# Patient Record
Sex: Female | Born: 1945 | Race: White | Hispanic: No | Marital: Married | Smoking: Never smoker
Health system: Southern US, Community
[De-identification: ages and names within clinical notes are randomized; demographics above are authoritative.]

## PROBLEM LIST (undated history)

## (undated) DIAGNOSIS — R112 Nausea with vomiting, unspecified: Secondary | ICD-10-CM

## (undated) DIAGNOSIS — K219 Gastro-esophageal reflux disease without esophagitis: Secondary | ICD-10-CM

## (undated) DIAGNOSIS — I10 Essential (primary) hypertension: Secondary | ICD-10-CM

## (undated) DIAGNOSIS — U071 COVID-19: Secondary | ICD-10-CM

## (undated) DIAGNOSIS — M539 Dorsopathy, unspecified: Secondary | ICD-10-CM

## (undated) DIAGNOSIS — Z9889 Other specified postprocedural states: Secondary | ICD-10-CM

## (undated) DIAGNOSIS — Z973 Presence of spectacles and contact lenses: Secondary | ICD-10-CM

## (undated) HISTORY — PX: COLONOSCOPY: SHX174

## (undated) HISTORY — PX: DILATION AND CURETTAGE OF UTERUS: SHX78

## (undated) HISTORY — PX: NOSE SURGERY: SHX723

---

## 2003-12-03 ENCOUNTER — Ambulatory Visit: Payer: Self-pay | Admitting: Internal Medicine

## 2005-01-02 ENCOUNTER — Ambulatory Visit: Payer: Self-pay | Admitting: Internal Medicine

## 2005-01-11 ENCOUNTER — Ambulatory Visit: Payer: Self-pay | Admitting: Internal Medicine

## 2006-01-10 ENCOUNTER — Ambulatory Visit: Payer: Self-pay | Admitting: Internal Medicine

## 2006-01-22 ENCOUNTER — Ambulatory Visit: Payer: Self-pay | Admitting: Gastroenterology

## 2007-01-14 ENCOUNTER — Ambulatory Visit: Payer: Self-pay | Admitting: Internal Medicine

## 2007-08-05 ENCOUNTER — Ambulatory Visit: Payer: Self-pay | Admitting: Unknown Physician Specialty

## 2008-01-20 ENCOUNTER — Ambulatory Visit: Payer: Self-pay | Admitting: Internal Medicine

## 2009-01-21 ENCOUNTER — Ambulatory Visit: Payer: Self-pay | Admitting: Internal Medicine

## 2010-01-27 ENCOUNTER — Ambulatory Visit: Payer: Self-pay | Admitting: Internal Medicine

## 2010-02-01 ENCOUNTER — Ambulatory Visit: Payer: Self-pay | Admitting: Unknown Physician Specialty

## 2011-04-20 ENCOUNTER — Ambulatory Visit: Payer: Self-pay | Admitting: Internal Medicine

## 2012-04-21 ENCOUNTER — Ambulatory Visit: Payer: Self-pay | Admitting: Internal Medicine

## 2013-06-24 DIAGNOSIS — M519 Unspecified thoracic, thoracolumbar and lumbosacral intervertebral disc disorder: Secondary | ICD-10-CM | POA: Insufficient documentation

## 2013-06-24 DIAGNOSIS — M5090 Cervical disc disorder, unspecified, unspecified cervical region: Secondary | ICD-10-CM | POA: Insufficient documentation

## 2013-06-24 DIAGNOSIS — I10 Essential (primary) hypertension: Secondary | ICD-10-CM | POA: Insufficient documentation

## 2013-07-22 ENCOUNTER — Ambulatory Visit: Payer: Self-pay | Admitting: Internal Medicine

## 2014-09-22 ENCOUNTER — Other Ambulatory Visit: Payer: Self-pay | Admitting: Internal Medicine

## 2014-09-22 DIAGNOSIS — Z1231 Encounter for screening mammogram for malignant neoplasm of breast: Secondary | ICD-10-CM

## 2014-09-29 ENCOUNTER — Ambulatory Visit: Payer: Self-pay

## 2014-10-06 ENCOUNTER — Other Ambulatory Visit: Payer: Self-pay | Admitting: Internal Medicine

## 2014-10-06 ENCOUNTER — Ambulatory Visit
Admission: RE | Admit: 2014-10-06 | Discharge: 2014-10-06 | Disposition: A | Discharge status: Home / Self Care | Payer: Medicare Other | Source: Ambulatory Visit | Attending: Internal Medicine | Admitting: Internal Medicine

## 2014-10-06 DIAGNOSIS — Z1231 Encounter for screening mammogram for malignant neoplasm of breast: Secondary | ICD-10-CM | POA: Diagnosis not present

## 2015-09-26 ENCOUNTER — Other Ambulatory Visit: Payer: Self-pay | Admitting: Internal Medicine

## 2015-09-26 DIAGNOSIS — Z1231 Encounter for screening mammogram for malignant neoplasm of breast: Secondary | ICD-10-CM

## 2015-10-14 ENCOUNTER — Ambulatory Visit
Admission: RE | Admit: 2015-10-14 | Discharge: 2015-10-14 | Disposition: A | Discharge status: Home / Self Care | Payer: Medicare HMO | Source: Ambulatory Visit | Attending: Internal Medicine | Admitting: Internal Medicine

## 2015-10-14 DIAGNOSIS — Z1231 Encounter for screening mammogram for malignant neoplasm of breast: Secondary | ICD-10-CM | POA: Insufficient documentation

## 2016-10-02 ENCOUNTER — Other Ambulatory Visit: Payer: Self-pay | Admitting: Internal Medicine

## 2016-10-02 DIAGNOSIS — Z1231 Encounter for screening mammogram for malignant neoplasm of breast: Secondary | ICD-10-CM

## 2016-10-23 ENCOUNTER — Ambulatory Visit
Admission: RE | Admit: 2016-10-23 | Discharge: 2016-10-23 | Disposition: A | Discharge status: Home / Self Care | Payer: Medicare HMO | Source: Ambulatory Visit | Attending: Internal Medicine | Admitting: Internal Medicine

## 2016-10-23 DIAGNOSIS — Z1231 Encounter for screening mammogram for malignant neoplasm of breast: Secondary | ICD-10-CM | POA: Insufficient documentation

## 2017-10-01 DIAGNOSIS — D369 Benign neoplasm, unspecified site: Secondary | ICD-10-CM | POA: Insufficient documentation

## 2017-10-08 ENCOUNTER — Other Ambulatory Visit: Payer: Self-pay | Admitting: Internal Medicine

## 2017-10-08 DIAGNOSIS — Z1231 Encounter for screening mammogram for malignant neoplasm of breast: Secondary | ICD-10-CM

## 2017-10-29 ENCOUNTER — Ambulatory Visit
Admission: RE | Admit: 2017-10-29 | Discharge: 2017-10-29 | Disposition: A | Discharge status: Home / Self Care | Payer: Medicare HMO | Source: Ambulatory Visit | Attending: Internal Medicine | Admitting: Internal Medicine

## 2017-10-29 DIAGNOSIS — Z1231 Encounter for screening mammogram for malignant neoplasm of breast: Secondary | ICD-10-CM | POA: Insufficient documentation

## 2018-09-23 ENCOUNTER — Other Ambulatory Visit: Payer: Self-pay | Admitting: Internal Medicine

## 2018-10-16 ENCOUNTER — Other Ambulatory Visit: Payer: Self-pay | Admitting: Internal Medicine

## 2018-10-16 DIAGNOSIS — Z1231 Encounter for screening mammogram for malignant neoplasm of breast: Secondary | ICD-10-CM

## 2018-11-21 ENCOUNTER — Ambulatory Visit
Admission: RE | Admit: 2018-11-21 | Discharge: 2018-11-21 | Disposition: A | Discharge status: Home / Self Care | Payer: Medicare HMO | Source: Ambulatory Visit | Attending: Internal Medicine | Admitting: Internal Medicine

## 2018-11-21 DIAGNOSIS — Z1231 Encounter for screening mammogram for malignant neoplasm of breast: Secondary | ICD-10-CM

## 2019-10-16 DIAGNOSIS — R42 Dizziness and giddiness: Secondary | ICD-10-CM

## 2019-10-16 HISTORY — DX: Dizziness and giddiness: R42

## 2019-10-28 ENCOUNTER — Other Ambulatory Visit: Payer: Self-pay | Admitting: Internal Medicine

## 2019-10-28 DIAGNOSIS — Z1231 Encounter for screening mammogram for malignant neoplasm of breast: Secondary | ICD-10-CM

## 2019-12-04 ENCOUNTER — Ambulatory Visit
Admission: RE | Admit: 2019-12-04 | Discharge: 2019-12-04 | Disposition: A | Discharge status: Home / Self Care | Payer: Medicare HMO | Source: Ambulatory Visit | Attending: Internal Medicine | Admitting: Internal Medicine

## 2019-12-04 ENCOUNTER — Other Ambulatory Visit: Payer: Self-pay

## 2019-12-04 DIAGNOSIS — Z1231 Encounter for screening mammogram for malignant neoplasm of breast: Secondary | ICD-10-CM | POA: Diagnosis present

## 2020-01-04 ENCOUNTER — Other Ambulatory Visit: Payer: Self-pay | Admitting: Podiatry

## 2020-01-04 DIAGNOSIS — M7672 Peroneal tendinitis, left leg: Secondary | ICD-10-CM

## 2020-01-04 DIAGNOSIS — S86312A Strain of muscle(s) and tendon(s) of peroneal muscle group at lower leg level, left leg, initial encounter: Secondary | ICD-10-CM

## 2020-01-07 ENCOUNTER — Other Ambulatory Visit: Payer: Self-pay

## 2020-01-07 ENCOUNTER — Ambulatory Visit
Admission: RE | Admit: 2020-01-07 | Discharge: 2020-01-07 | Disposition: A | Discharge status: Home / Self Care | Payer: Medicare HMO | Source: Ambulatory Visit | Attending: Podiatry | Admitting: Podiatry

## 2020-01-07 DIAGNOSIS — M7672 Peroneal tendinitis, left leg: Secondary | ICD-10-CM | POA: Insufficient documentation

## 2020-01-07 DIAGNOSIS — S86312A Strain of muscle(s) and tendon(s) of peroneal muscle group at lower leg level, left leg, initial encounter: Secondary | ICD-10-CM | POA: Insufficient documentation

## 2020-01-26 ENCOUNTER — Encounter: Payer: Self-pay | Admitting: Podiatry

## 2020-01-26 ENCOUNTER — Other Ambulatory Visit: Payer: Self-pay

## 2020-02-02 ENCOUNTER — Other Ambulatory Visit
Admission: RE | Admit: 2020-02-02 | Discharge: 2020-02-02 | Disposition: A | Discharge status: Home / Self Care | Payer: Medicare HMO | Source: Ambulatory Visit | Attending: Podiatry | Admitting: Podiatry

## 2020-02-02 ENCOUNTER — Other Ambulatory Visit: Payer: Self-pay

## 2020-02-02 DIAGNOSIS — Z20822 Contact with and (suspected) exposure to covid-19: Secondary | ICD-10-CM | POA: Diagnosis not present

## 2020-02-02 DIAGNOSIS — Z01812 Encounter for preprocedural laboratory examination: Secondary | ICD-10-CM | POA: Diagnosis present

## 2020-02-02 LAB — SARS CORONAVIRUS 2 (TAT 6-24 HRS): SARS Coronavirus 2: NEGATIVE

## 2020-02-03 ENCOUNTER — Other Ambulatory Visit: Payer: Self-pay | Admitting: Podiatry

## 2020-02-04 ENCOUNTER — Encounter
Admission: RE | Disposition: A | Discharge status: Home / Self Care | Payer: Self-pay | Source: Home / Self Care | Attending: Podiatry

## 2020-02-04 ENCOUNTER — Ambulatory Visit
Admission: RE | Admit: 2020-02-04 | Discharge: 2020-02-04 | Disposition: A | Discharge status: Home / Self Care | Payer: Medicare HMO | Attending: Podiatry | Admitting: Podiatry

## 2020-02-04 ENCOUNTER — Other Ambulatory Visit: Payer: Self-pay

## 2020-02-04 ENCOUNTER — Ambulatory Visit: Payer: Medicare HMO | Admitting: Anesthesiology

## 2020-02-04 ENCOUNTER — Encounter: Payer: Self-pay | Admitting: Podiatry

## 2020-02-04 DIAGNOSIS — Z79899 Other long term (current) drug therapy: Secondary | ICD-10-CM | POA: Diagnosis not present

## 2020-02-04 DIAGNOSIS — Z791 Long term (current) use of non-steroidal anti-inflammatories (NSAID): Secondary | ICD-10-CM | POA: Diagnosis not present

## 2020-02-04 DIAGNOSIS — G8929 Other chronic pain: Secondary | ICD-10-CM | POA: Diagnosis not present

## 2020-02-04 DIAGNOSIS — M25472 Effusion, left ankle: Secondary | ICD-10-CM | POA: Insufficient documentation

## 2020-02-04 DIAGNOSIS — X58XXXA Exposure to other specified factors, initial encounter: Secondary | ICD-10-CM | POA: Diagnosis not present

## 2020-02-04 DIAGNOSIS — M659 Synovitis and tenosynovitis, unspecified: Secondary | ICD-10-CM | POA: Diagnosis present

## 2020-02-04 DIAGNOSIS — S86312A Strain of muscle(s) and tendon(s) of peroneal muscle group at lower leg level, left leg, initial encounter: Secondary | ICD-10-CM | POA: Insufficient documentation

## 2020-02-04 DIAGNOSIS — Z09 Encounter for follow-up examination after completed treatment for conditions other than malignant neoplasm: Secondary | ICD-10-CM

## 2020-02-04 HISTORY — DX: Essential (primary) hypertension: I10

## 2020-02-04 HISTORY — DX: Gastro-esophageal reflux disease without esophagitis: K21.9

## 2020-02-04 HISTORY — PX: TENDON REPAIR: SHX5111

## 2020-02-04 HISTORY — DX: Presence of spectacles and contact lenses: Z97.3

## 2020-02-04 HISTORY — DX: Other specified postprocedural states: Z98.890

## 2020-02-04 HISTORY — DX: Dorsopathy, unspecified: M53.9

## 2020-02-04 HISTORY — DX: Nausea with vomiting, unspecified: R11.2

## 2020-02-04 SURGERY — TENDON REPAIR
Anesthesia: General | Site: Foot | Laterality: Left

## 2020-02-04 MED ORDER — ACETAMINOPHEN 10 MG/ML IV SOLN
INTRAVENOUS | Status: DC | PRN
  Administered 2020-02-04: 1000 mg via INTRAVENOUS

## 2020-02-04 MED ORDER — 0.9 % SODIUM CHLORIDE (POUR BTL) OPTIME
TOPICAL | Status: DC | PRN
  Administered 2020-02-04: 500 mL
  Administered 2020-02-04: 250 mL

## 2020-02-04 MED ORDER — BUPIVACAINE LIPOSOME 1.3 % IJ SUSP
INTRAMUSCULAR | Status: DC | PRN
  Administered 2020-02-04: 266 mg

## 2020-02-04 MED ORDER — ONDANSETRON HCL 4 MG/2ML IJ SOLN: 4.0000 mg | Freq: Once | INTRAMUSCULAR | Status: DC | PRN

## 2020-02-04 MED ORDER — MIDAZOLAM HCL 2 MG/2ML IJ SOLN
INTRAMUSCULAR | Status: DC | PRN
  Administered 2020-02-04: 2 mg via INTRAVENOUS

## 2020-02-04 MED ORDER — LIDOCAINE HCL (CARDIAC) PF 100 MG/5ML IV SOSY
PREFILLED_SYRINGE | INTRAVENOUS | Status: DC | PRN
  Administered 2020-02-04: 30 mg via INTRATRACHEAL

## 2020-02-04 MED ORDER — PROPOFOL 10 MG/ML IV BOLUS
INTRAVENOUS | Status: DC | PRN
  Administered 2020-02-04: 100 ug/kg/min via INTRAVENOUS
  Administered 2020-02-04: 100 mg via INTRAVENOUS

## 2020-02-04 MED ORDER — ASPIRIN EC 81 MG PO TBEC: 81.0000 mg | DELAYED_RELEASE_TABLET | Freq: Two times a day (BID) | ORAL | 0 refills | Status: AC

## 2020-02-04 MED ORDER — KETOROLAC TROMETHAMINE 30 MG/ML IJ SOLN: 15.0000 mg | Freq: Once | INTRAMUSCULAR | Status: DC | PRN

## 2020-02-04 MED ORDER — POVIDONE-IODINE 7.5 % EX SOLN: Freq: Once | CUTANEOUS | Status: DC

## 2020-02-04 MED ORDER — CEFAZOLIN SODIUM-DEXTROSE 2-4 GM/100ML-% IV SOLN
2.0000 g | INTRAVENOUS | Status: AC
  Administered 2020-02-04: 2 g via INTRAVENOUS

## 2020-02-04 MED ORDER — OXYCODONE-ACETAMINOPHEN 5-325 MG PO TABS: 1.0000 | ORAL_TABLET | Freq: Four times a day (QID) | ORAL | 0 refills | Status: AC | PRN

## 2020-02-04 MED ORDER — CEPHALEXIN 500 MG PO CAPS: 500.0000 mg | ORAL_CAPSULE | Freq: Three times a day (TID) | ORAL | 0 refills | Status: AC

## 2020-02-04 MED ORDER — ONDANSETRON HCL 4 MG/2ML IJ SOLN
INTRAMUSCULAR | Status: DC | PRN
  Administered 2020-02-04: 4 mg via INTRAVENOUS

## 2020-02-04 MED ORDER — LACTATED RINGERS IV SOLN: INTRAVENOUS | Status: DC

## 2020-02-04 MED ORDER — DEXAMETHASONE SODIUM PHOSPHATE 4 MG/ML IJ SOLN
INTRAMUSCULAR | Status: DC | PRN
  Administered 2020-02-04: 4 mg via INTRAVENOUS

## 2020-02-04 MED ORDER — FENTANYL CITRATE (PF) 100 MCG/2ML IJ SOLN: 25.0000 ug | INTRAMUSCULAR | Status: DC | PRN

## 2020-02-04 MED ORDER — BUPIVACAINE HCL (PF) 0.5 % IJ SOLN
INTRAMUSCULAR | Status: DC | PRN
  Administered 2020-02-04: 100 mg

## 2020-02-04 MED ORDER — FENTANYL CITRATE (PF) 250 MCG/5ML IJ SOLN
INTRAMUSCULAR | Status: DC | PRN
  Administered 2020-02-04: 25 ug via INTRAVENOUS
  Administered 2020-02-04: 100 ug via INTRAVENOUS
  Administered 2020-02-04 (×3): 25 ug via INTRAVENOUS

## 2020-02-04 MED ORDER — OXYCODONE HCL 5 MG/5ML PO SOLN
5.0000 mg | Freq: Once | ORAL | Status: DC | PRN
Start: 2020-02-04 — End: 2020-02-04

## 2020-02-04 MED ORDER — OXYCODONE HCL 5 MG PO TABS
5.0000 mg | ORAL_TABLET | Freq: Once | ORAL | Status: DC | PRN
Start: 2020-02-04 — End: 2020-02-04

## 2020-02-04 SURGICAL SUPPLY — 52 items
APL SKNCLS STERI-STRIP NONHPOA (GAUZE/BANDAGES/DRESSINGS)
BANDAGE ELASTIC 4 LF NS (GAUZE/BANDAGES/DRESSINGS) ×2 IMPLANT
BENZOIN TINCTURE PRP APPL 2/3 (GAUZE/BANDAGES/DRESSINGS) IMPLANT
BLADE MINI RND TIP GREEN BEAV (BLADE) ×2 IMPLANT
BLADE SURG 15 STRL LF DISP TIS (BLADE) IMPLANT
BLADE SURG 15 STRL SS (BLADE)
BNDG CMPR 75X41 PLY HI ABS (GAUZE/BANDAGES/DRESSINGS)
BNDG CMPR MED 5X4 ELC HKLP NS (GAUZE/BANDAGES/DRESSINGS) ×1
BNDG COHESIVE 4X5 TAN STRL (GAUZE/BANDAGES/DRESSINGS) ×2 IMPLANT
BNDG ESMARK 4X12 TAN STRL LF (GAUZE/BANDAGES/DRESSINGS) ×2 IMPLANT
BNDG GAUZE 4.5X4.1 6PLY STRL (MISCELLANEOUS) ×2 IMPLANT
BNDG STRETCH 4X75 STRL LF (GAUZE/BANDAGES/DRESSINGS) IMPLANT
CANISTER SUCT 1200ML W/VALVE (MISCELLANEOUS) ×2 IMPLANT
COVER LIGHT HANDLE UNIVERSAL (MISCELLANEOUS) ×4 IMPLANT
CUFF TOURN SGL QUICK 30 (TOURNIQUET CUFF) ×2
CUFF TOURN SGL QUICK 34 (TOURNIQUET CUFF) ×2
CUFF TRNQT CYL 30X4X21-28X (TOURNIQUET CUFF) ×1 IMPLANT
CUFF TRNQT CYL 34X4X40X1 (TOURNIQUET CUFF) ×1 IMPLANT
DURAPREP 26ML APPLICATOR (WOUND CARE) ×2 IMPLANT
ELECT REM PT RETURN 9FT ADLT (ELECTROSURGICAL) ×2
ELECTRODE REM PT RTRN 9FT ADLT (ELECTROSURGICAL) ×1 IMPLANT
ETHIBOND 2 0 GREEN CT 2 30IN (SUTURE) ×2 IMPLANT
GAUZE SPONGE 4X4 12PLY STRL (GAUZE/BANDAGES/DRESSINGS) ×2 IMPLANT
GAUZE XEROFORM 1X8 LF (GAUZE/BANDAGES/DRESSINGS) ×2 IMPLANT
GLOVE BIO SURGEON STRL SZ7 (GLOVE) ×2 IMPLANT
GLOVE SURG UNDER POLY LF SZ7 (GLOVE) ×2 IMPLANT
GOWN STRL REUS W/ TWL XL LVL3 (GOWN DISPOSABLE) ×1 IMPLANT
GOWN STRL REUS W/TWL XL LVL3 (GOWN DISPOSABLE) ×2
IV NS 250ML (IV SOLUTION)
IV NS 250ML BAXH (IV SOLUTION) IMPLANT
K-WIRE DBL END TROCAR 6X.045 (WIRE)
K-WIRE DBL END TROCAR 6X.062 (WIRE)
KIT TURNOVER KIT A (KITS) ×2 IMPLANT
KWIRE DBL END TROCAR 6X.045 (WIRE) IMPLANT
KWIRE DBL END TROCAR 6X.062 (WIRE) IMPLANT
NS IRRIG 500ML POUR BTL (IV SOLUTION) ×2 IMPLANT
PACK EXTREMITY ARMC (MISCELLANEOUS) ×2 IMPLANT
PAD ABD DERMACEA PRESS 5X9 (GAUZE/BANDAGES/DRESSINGS) ×1 IMPLANT
PADDING CAST BLEND 4X4 NS (MISCELLANEOUS) ×2 IMPLANT
PENCIL SMOKE EVACUATOR (MISCELLANEOUS) ×2 IMPLANT
RASP SM TEAR CROSS CUT (RASP) IMPLANT
SPLINT CAST 1 STEP 4X30 (MISCELLANEOUS) IMPLANT
STOCKINETTE IMPERVIOUS LG (DRAPES) ×2 IMPLANT
STRIP CLOSURE SKIN 1/4X4 (GAUZE/BANDAGES/DRESSINGS) ×2 IMPLANT
SUT ETHILON 4 0 PS 2 18 (SUTURE) ×2 IMPLANT
SUT ETHILON 5-0 FS-2 18 BLK (SUTURE) ×2 IMPLANT
SUT VIC AB 2-0 SH 27 (SUTURE) ×2
SUT VIC AB 2-0 SH 27XBRD (SUTURE) IMPLANT
SUT VIC AB 3-0 SH 27 (SUTURE) ×2
SUT VIC AB 3-0 SH 27X BRD (SUTURE) IMPLANT
SUT VIC AB 4-0 FS2 27 (SUTURE) ×2 IMPLANT
WAND TENDON TOPAZ 0 ANGL (MISCELLANEOUS) ×1 IMPLANT

## 2020-02-04 NOTE — Transfer of Care (Signed)
Immediate Anesthesia Transfer of Care Note  Patient: Alexandria Carroll  Procedure(s) Performed: FLEXOR TENDON REPAIR - SECOND LEFT (Left Foot)  Patient Location: PACU  Anesthesia Type: General  Level of Consciousness: awake, alert  and patient cooperative  Airway and Oxygen Therapy: Patient Spontanous Breathing and Patient connected to supplemental oxygen  Post-op Assessment: Post-op Vital signs reviewed, Patient's Cardiovascular Status Stable, Respiratory Function Stable, Patent Airway and No signs of Nausea or vomiting  Post-op Vital Signs: Reviewed and stable  Complications: No complications documented.

## 2020-02-04 NOTE — Op Note (Signed)
PODIATRY / FOOT AND ANKLE SURGERY OPERATIVE REPORT    SURGEON: Caroline More, DPM  PRE-OPERATIVE DIAGNOSIS:  1.  Left peroneus longus and brevis tendon tears with tenosynovitis and tendinosis 2.  Left ankle pain and swelling  POST-OPERATIVE DIAGNOSIS: Same  PROCEDURE(S): 1. Left peroneus brevis and peroneus longus tenosynovectomies 2. Left peroneus brevis and peroneus longus tendon debridements with Topaz 3. Left peroneus brevis tendon repair with retubularization 4. Left peroneus longus tendon anastomosis to peroneus brevis tendon  HEMOSTASIS: Left thigh tourniquet  ANESTHESIA: general with preoperative saphenous and popliteal nerve blocks per anesthesia  ESTIMATED BLOOD LOSS: 10 cc  FINDING(S): 1.  Left complete rupture of the peroneus longus tendon about the lateral calcaneal tubercle with tendinosis of tendon large amount of synovitis 2.  Left partial tear peroneus brevis tendon with tendinosis and synovitis  PATHOLOGY/SPECIMEN(S): None  INDICATIONS:   Alexandria Carroll is a 75 y.o. female who presents with chronic pain to the left lateral ankle.  Patient was treated by Dr. Cleda Mccreedy for this issue and was treated with a series of conservative measures including booting, taping, strapping, changes in shoe gear, bracing, injections, and physical therapy along with medication management.  Patient did not get better after this and a MRI was taken showing extensive damage to the peroneus longus and brevis tendons with potentially complete rupture of the peroneus longus tendon with tenosynovitis of both tendons and tendinosis.  Discussed all treatment options with the patient involving both conservative and surgical attempts at correction include potential risks and complications and at this time patient is elected for surgical procedure today consisting of peroneal tendon repair with debridement and tenosynovectomy's with possible anastomosis of peroneus longus to brevis tendon.  All  questions answered including postoperative course.  No guarantees given.  DESCRIPTION: After obtaining full informed written consent, the patient was brought back to the operating room and placed supine upon the operating table.  The patient received IV antibiotics prior to induction.  The patient had a preoperative popliteal and saphenous nerve block performed by anesthesia prior to the procedure.  After obtaining adequate anesthesia, the patient was prepped and draped in the standard fashion.  An Esmarch bandage was used to exsanguinate the left lower extremity and the pneumatic thigh tourniquet was inflated.  Attention was then directed to the left lateral ankle where an incision was made from the posterior aspect of the lateral malleolus along the course of the peroneal tendons to the lateral foot to about the calcaneocuboid joint.  The incision was deepened to the subcutaneous tissues utilizing sharp and blunt dissection care was taken to identify and retract all vital neurovascular structures and all venous contributories were cauterized as necessary.  At this time the peroneus longus and brevis tendon sheaths were identified and the tendon sheath was incised along the course the entirety of the incision of the large amount of synovitic fluid was able to be released from the tendon sheath.  The tendons were then inspected and visualized and the peroneus longus tendon appeared to be completely ruptured and retracted to the level of the lateral tubercle of the calcaneus and the distal stump appeared to be nowhere and site along the course the incision line.  The peroneus longus tendon appeared to be thickened and appeared to be very frayed with some scar tissue.  The peroneus brevis tendon was also identified and noted to have a split thickness tear along the entirety of the incision.  The split thickness tear of the peroneus brevis  tendon was then debrided further removing a small portion of the tendon as  it appeared to be frayed and completely torn.  The tendon still appeared to have at least 50% of its original tissue after the debridement was performed.  Further debridement was performed of the peroneal tendon sheath removing all synovitic tissue from the area.  The peroneus brevis tendon was then debrided removing the fibrotic stump to the tendon to fresh healthy looking tendon.  Both tendons were then debrided further with the Topaz instrumentation.  The peroneus brevis tendon was then retubularized with 2-0 Ethibond in a running interconnected type stitch.  The peroneus longus tendon was then anastomosed to the peroneus brevis tendon behind the posterior lateral malleolus all the way to the remaining portion of the tendon which is around the distal tip of the fibula, this was performed with a combination of 2-0 Ethibond and 2-0 Vicryl.  The tendon repairs were then reinforced further with 2-0 Vicryl.  The tendons were then brought through range of motion and noted to sit excellently behind the posterior lateral malleolus and appeared to have good gliding motion.  The surgical site was flushed with copious amounts normal sterile saline.  The peroneal retinaculum was then reapproximated well coapted with a combination of 2-0 Ethibond and 2-0 Vicryl.  The peroneal tendon sheath was then reapproximated well coapted with 3-0 Vicryl.  The subcutaneous tissues and reapproximated well coapted with 4-0 Vicryl and the skin was reapproximated well coapted with 4-0 nylon in combination of simple and horizontal mattress type stitching.  The pneumatic ankle tourniquet was deflated and a prompt hyperemic response was noted to all digits of the left foot.  Hemostasis appeared to be well under control.  Applied postoperative dressing consisting of Xeroform to the incision line followed by 4 x 4 gauze, ABD, Kerlix, web roll, posterior splint, Ace wrap.  Patient tolerated the procedure and anesthesia well was transferred to  recovery room vital signs stable vascular status intact all toes left foot.  The patient will be discharged home with the appropriate follow-up instructions and orders as well as medications which have been sent to the pharmacy prior to discharge.  Patient is to follow-up in clinic within 1 week of surgical date.  Patient is to remain nonweightbearing and keep surgical dressings clean, dry, and intact until that time and take medications as prescribed as well as follow-up orders.  Discussed with husband in detail after procedure  COMPLICATIONS: None  CONDITION: Good, stable  Caroline More, DPM

## 2020-02-04 NOTE — Discharge Instructions (Signed)
[place] DR. TROXLER, DR. Vickki Muff, AND DR. [place]   1. Take your medication as prescribed.  Pain medication should be taken only as needed.  You may also take ibuprofen or Tylenol between pain medication doses as needed.  The preoperative nerve block that was performed should last between 8 to 24 hours but will wear off over time.  Expect some numbness in the foot postoperatively but this should come back with time.  2. Keep the dressing clean, dry and intact.  3. Keep your foot elevated above the heart level for the first 48 hours.  Keep it elevated thereafter for further swelling control.  If you feel as though the Ace wrap is too tight you may remove the Ace wrap and put it back on but leave the posterior splint in place.  Try to keep her dressings clean and intact though is much as possible.  Do not get dressings wet.  If wanting to perform ice therapy it is okay to put it on the ankle area for maximum of 10 minutes out of every hour as needed.  4. Always use crutches, knee scooter, or wheelchair to get around and do not put any weight on your left foot.  5. Do not take a shower. Baths are permissible as long as the foot is kept out of the water.   6. Begin taking aspirin 81 mg twice daily starting tomorrow for DVT prophylaxis.  Also work on knee bends and calf massages as described below for further DVT prophylaxis.  7. Every hour you are awake:  - Bend your knee 15 times. - Massage calf 15 times  8. Call [facility] [phone]) if any of the following problems occur: - You develop a temperature or fever. - The bandage becomes saturated with blood. - Medication does not stop your pain. - Injury of the foot occurs. - Any symptoms of infection including redness, odor, or red streaks running from wound.  General Anesthesia, Adult, Care After This sheet gives you  information about how to care for yourself after your procedure. Your health care provider may also give you more specific instructions. If you have problems or questions, contact your health care provider. What can I expect after the procedure? After the procedure, the following side effects are common:  Pain or discomfort at the IV site.  Nausea.  Vomiting.  Sore throat.  Trouble concentrating.  Feeling cold or chills.  Feeling weak or tired.  Sleepiness and fatigue.  Soreness and body aches. These side effects can affect parts of the body that were not involved in surgery. Follow these instructions at home: For the time period you were told by your health care provider:  Rest.  Do not participate in activities where you could fall or become injured.  Do not drive or use machinery.  Do not drink alcohol.  Do not take sleeping pills or medicines that cause drowsiness.  Do not make important decisions or sign legal documents.  Do not take care of children on your own.   Eating and drinking  Follow any instructions from your health care provider about eating or drinking restrictions.  When you feel hungry, start by eating small amounts of foods that are soft and easy to digest (bland), such as toast. Gradually return to your regular diet.  Drink enough fluid to keep your urine pale yellow.  If you vomit, rehydrate by drinking water,  juice, or clear broth. General instructions  If you have sleep apnea, surgery and certain medicines can increase your risk for breathing problems. Follow instructions from your health care provider about wearing your sleep device: ? Anytime you are sleeping, including during daytime naps. ? While taking prescription pain medicines, sleeping medicines, or medicines that make you drowsy.  Have a responsible adult stay with you for the time you are told. It is important to have someone help care for you until you are awake and  alert.  Return to your normal activities as told by your health care provider. Ask your health care provider what activities are safe for you.  Take over-the-counter and prescription medicines only as told by your health care provider.  If you smoke, do not smoke without supervision.  Keep all follow-up visits as told by your health care provider. This is important. Contact a health care provider if:  You have nausea or vomiting that does not get better with medicine.  You cannot eat or drink without vomiting.  You have pain that does not get better with medicine.  You are unable to pass urine.  You develop a skin rash.  You have a fever.  You have redness around your IV site that gets worse. Get help right away if:  You have difficulty breathing.  You have chest pain.  You have blood in your urine or stool, or you vomit blood. Summary  After the procedure, it is common to have a sore throat or nausea. It is also common to feel tired.  Have a responsible adult stay with you for the time you are told. It is important to have someone help care for you until you are awake and alert.  When you feel hungry, start by eating small amounts of foods that are soft and easy to digest (bland), such as toast. Gradually return to your regular diet.  Drink enough fluid to keep your urine pale yellow.  Return to your normal activities as told by your health care provider. Ask your health care provider what activities are safe for you. This information is not intended to replace advice given to you by your health care provider. Make sure you discuss any questions you have with your health care provider. Document Revised: 09/17/2019 Document Reviewed: 04/16/2019 Elsevier Patient Education  2021 [place] for Discharge Teaching: EXPAREL (bupivacaine liposome injectable suspension)   Your surgeon or anesthesiologist gave you EXPAREL(bupivacaine) to help control your  pain after surgery.   EXPAREL is a local anesthetic that provides pain relief by numbing the tissue around the surgical site.  EXPAREL is designed to release pain medication over time and can control pain for up to 72 hours.  Depending on how you respond to EXPAREL, you may require less pain medication during your recovery.  Possible side effects:  Temporary loss of sensation or ability to move in the area where bupivacaine was injected.  Nausea, vomiting, constipation  Rarely, numbness and tingling in your mouth or lips, lightheadedness, or anxiety may occur.  Call your doctor right away if you think you may be experiencing any of these sensations, or if you have other questions regarding possible side effects.  Follow all other discharge instructions given to you by your surgeon or nurse. Eat a healthy diet and drink plenty of water or other fluids.  If you return to the hospital for any reason within 96 hours following the administration of EXPAREL, it is important for health  care providers to know that you have received this anesthetic. A teal colored band has been placed on your arm with the date, time and amount of EXPAREL you have received in order to alert and inform your health care providers. Please leave this armband in place for the full 96 hours following administration, and then you may remove the band.

## 2020-02-04 NOTE — Progress Notes (Signed)
[facility] ANMD with left, ultrasound guided, popliteal/saphenous block. Side rails up, monitors on throughout procedure. See vital signs in flow sheet. Tolerated Procedure well.

## 2020-02-04 NOTE — Anesthesia Procedure Notes (Signed)
Anesthesia Regional Block: Popliteal block   Pre-Anesthetic Checklist: ,, timeout performed, Correct Patient, Correct Site, Correct Laterality, Correct Procedure, Correct Position, site marked, Risks and benefits discussed,  Surgical consent,  Pre-op evaluation,  At surgeon's request and post-op pain management  Laterality: Left  Prep: chloraprep       Needles:  Injection technique: Single-shot  Needle Type: Echogenic Needle     Needle Length: 9cm  Needle Gauge: 21     Additional Needles:   Procedures:,,,, ultrasound used (permanent image in chart),,,,  Narrative:  Start time: 02/04/2020 8:30 AM End time: 02/04/2020 8:32 AM Injection made incrementally with aspirations every 5 mL.  Performed by: Personally  Anesthesiologist: Sinda Du, MD  Additional Notes: Functioning IV was confirmed and monitors applied. Ultrasound guidance: relevant anatomy identified, needle position confirmed, local anesthetic spread visualized around nerve(s)., vascular puncture avoided.  Image printed for medical record.  Negative aspiration and no paresthesias; incremental administration of local anesthetic. The patient tolerated the procedure well. Vitals signes recorded in RN notes.

## 2020-02-04 NOTE — Anesthesia Procedure Notes (Signed)
Procedure Name: MAC Date/Time: 02/04/2020 9:12 AM Performed by: Jeannene Patella, CRNA Pre-anesthesia Checklist: Patient identified, Emergency Drugs available, Suction available, Patient being monitored and Timeout performed Patient Re-evaluated:Patient Re-evaluated prior to induction Oxygen Delivery Method: Simple face mask Placement Confirmation: positive ETCO2 and breath sounds checked- equal and bilateral

## 2020-02-04 NOTE — H&P (Signed)
HISTORY AND PHYSICAL INTERVAL NOTE:  02/04/2020  9:05 AM  Alexandria Carroll Clent Ridges  has presented today for surgery, with the diagnosis of S86.312A  RUPTURE OF PERONEAL TENDON LEFT FOOT, TENOSYNOVITIS PERONEAL TENDONS, TENDINOSIS PERONEAL TENDONS.  The various methods of treatment have been discussed with the patient.  No guarantees were given.  After consideration of risks, benefits and other options for treatment, the patient has consented to surgery.  I have reviewed the patients' chart and labs.    PROCEDURE: LEFT PERONEAL TENDON REPAIR WITH DEBRIDEMENT, TENOSYNOVECTOMY, AND POSSIBLE ANASTOMOSIS/REINFORCEMENT OF TENDON  A history and physical examination was performed in my office.  The patient was reexamined.  There have been no changes to this history and physical examination.  Caroline More, DPM

## 2020-02-04 NOTE — Anesthesia Preprocedure Evaluation (Addendum)
Anesthesia Evaluation  Patient identified by MRN, date of birth, ID band Patient awake    Reviewed: Allergy & Precautions, H&P , NPO status , Patient's Chart, lab work & pertinent test results  History of Anesthesia Complications (+) PONV and history of anesthetic complications  Airway Mallampati: II  TM Distance: >3 FB Neck ROM: Full    Dental no notable dental hx.    Pulmonary neg pulmonary ROS,    Pulmonary exam normal breath sounds clear to auscultation       Cardiovascular Exercise Tolerance: Good hypertension, Normal cardiovascular exam Rhythm:Regular Rate:Normal     Neuro/Psych negative neurological ROS  negative psych ROS   GI/Hepatic Neg liver ROS, GERD  ,  Endo/Other  negative endocrine ROS  Renal/GU negative Renal ROS  negative genitourinary   Musculoskeletal  (+) Arthritis ,   Abdominal (+) + obese,   Peds negative pediatric ROS (+)  Hematology negative hematology ROS (+)   Anesthesia Other Findings   Reproductive/Obstetrics negative OB ROS                             Anesthesia Physical Anesthesia Plan  ASA: III  Anesthesia Plan: General and Regional   Post-op Pain Management:  Regional for Post-op pain   Induction: Intravenous  PONV Risk Score and Plan: 3 and Treatment may vary due to age or medical condition  Airway Management Planned: Natural Airway and Nasal Cannula  Additional Equipment:   Intra-op Plan:   Post-operative Plan:   Informed Consent: I have reviewed the patients History and Physical, chart, labs and discussed the procedure including the risks, benefits and alternatives for the proposed anesthesia with the patient or authorized representative who has indicated his/her understanding and acceptance.     Dental advisory given  Plan Discussed with: CRNA, Anesthesiologist and Surgeon  Anesthesia Plan Comments:        Anesthesia Quick  Evaluation   Risks and benefits of anesthesia discussed at length, patient or surrogate demonstrates understanding. Appropriately NPO. Plan to proceed with anesthesia.  Champ Mungo, MD 02/04/20

## 2020-02-04 NOTE — Anesthesia Postprocedure Evaluation (Signed)
Anesthesia Post Note  Patient: Alexandria Carroll  Procedure(s) Performed: FLEXOR TENDON REPAIR - SECOND LEFT (Left Foot)     Patient location during evaluation: PACU Anesthesia Type: General Level of consciousness: awake and alert Pain management: pain level controlled Vital Signs Assessment: post-procedure vital signs reviewed and stable Respiratory status: spontaneous breathing, nonlabored ventilation, respiratory function stable and patient connected to nasal cannula oxygen Cardiovascular status: stable and blood pressure returned to baseline Postop Assessment: no apparent nausea or vomiting Anesthetic complications: no   No complications documented.  Sinda Du

## 2020-02-04 NOTE — Anesthesia Procedure Notes (Signed)
Anesthesia Regional Block: Adductor canal block   Pre-Anesthetic Checklist: ,, timeout performed, Correct Patient, Correct Site, Correct Laterality, Correct Procedure, Correct Position, site marked, Risks and benefits discussed,  Surgical consent,  Pre-op evaluation,  At surgeon's request and post-op pain management  Laterality: Left  Prep: chloraprep       Needles:  Injection technique: Single-shot  Needle Type: Echogenic Needle     Needle Length: 9cm  Needle Gauge: 21     Additional Needles:   Procedures:,,,, ultrasound used (permanent image in chart),,,,  Narrative:  Start time: 02/04/2020 8:33 AM End time: 02/04/2020 8:35 AM Injection made incrementally with aspirations every 5 mL.  Performed by: Personally  Anesthesiologist: Sinda Du, MD  Additional Notes: Functioning IV was confirmed and monitors applied. Ultrasound guidance: relevant anatomy identified, needle position confirmed, local anesthetic spread visualized around nerve(s)., vascular puncture avoided.  Image printed for medical record.  Negative aspiration and no paresthesias; incremental administration of local anesthetic. The patient tolerated the procedure well. Vitals signes recorded in RN notes.

## 2020-02-05 ENCOUNTER — Encounter: Payer: Self-pay | Admitting: Podiatry

## 2020-10-20 ENCOUNTER — Other Ambulatory Visit: Payer: Self-pay | Admitting: Internal Medicine

## 2020-10-20 DIAGNOSIS — Z1231 Encounter for screening mammogram for malignant neoplasm of breast: Secondary | ICD-10-CM

## 2020-12-05 ENCOUNTER — Other Ambulatory Visit: Payer: Self-pay

## 2020-12-05 ENCOUNTER — Ambulatory Visit
Admission: RE | Admit: 2020-12-05 | Discharge: 2020-12-05 | Disposition: A | Discharge status: Home / Self Care | Payer: Medicare HMO | Source: Ambulatory Visit | Attending: Internal Medicine | Admitting: Internal Medicine

## 2020-12-05 DIAGNOSIS — Z1231 Encounter for screening mammogram for malignant neoplasm of breast: Secondary | ICD-10-CM | POA: Insufficient documentation

## 2021-10-01 DIAGNOSIS — M1711 Unilateral primary osteoarthritis, right knee: Secondary | ICD-10-CM | POA: Insufficient documentation

## 2021-10-22 NOTE — Discharge Instructions (Signed)
Instructions after Total Knee Replacement   Alexandria Carroll, Jr., M.D.     Dept. of Orthopaedics & Sports Medicine  [facility]  [address]  [place], [place]  [address]  Phone: [phone]   Fax: [phone]    DIET: Drink plenty of non-alcoholic fluids. Resume your normal diet. Include foods high in fiber.  ACTIVITY:  You may use crutches or a walker with weight-bearing as tolerated, unless instructed otherwise. You may be weaned off of the walker or crutches by your Physical Therapist.  Do NOT place pillows under the knee. Anything placed under the knee could limit your ability to straighten the knee.   Continue doing gentle exercises. Exercising will reduce the pain and swelling, increase motion, and prevent muscle weakness.   Please continue to use the TED compression stockings for 6 weeks. You may remove the stockings at night, but should reapply them in the morning. Do not drive or operate any equipment until instructed.  WOUND CARE:  Continue to use the PolarCare or ice packs periodically to reduce pain and swelling. You may bathe or shower after the staples are removed at the first office visit following surgery.  MEDICATIONS: You may resume your regular medications. Please take the pain medication as prescribed on the medication. Do not take pain medication on an empty stomach. You have been given a prescription for a blood thinner (Lovenox or Coumadin). Please take the medication as instructed. (NOTE: After completing a 2 week course of Lovenox, take one Enteric-coated aspirin once a day. This along with elevation will help reduce the possibility of phlebitis in your operated leg.) Do not drive or drink alcoholic beverages when taking pain medications.  CALL THE OFFICE FOR: Temperature above 101 degrees Excessive bleeding or drainage on the dressing. Excessive swelling, coldness, or paleness of the toes. Persistent nausea and vomiting.  FOLLOW-UP:  You  should have an appointment to return to the office in 10-14 days after surgery. Arrangements have been made for continuation of Physical Therapy (either home therapy or outpatient therapy).   [facility] Department Directory         [email or web address]       [email or web address]          Cardiology  Appointments: [place] - [phone] [place] - [phone]  Endocrinology  Appointments: [place] - [phone] [place] - [phone]  Gastroenterology  Appointments: [place] - [phone] [place] - [phone]        General Surgery   Appointments: [place] - [phone]  Internal Medicine/Family Medicine  Appointments: [place] - [phone] [place] - [phone] [place] - [phone]  Metabolic and Weigh Loss Surgery  Appointments: [place] - [phone]        Neurology  Appointments: [place] - [phone] [place] - [phone]  Neurosurgery  Appointments: [place] - [phone]  Obstetrics & Gynecology  Appointments: [place] - [phone] [place] - [phone]        Pediatrics  Appointments: [place] - [phone] [place] - [phone]  Physiatry  Appointments: [place] -[phone]  Physical Therapy  Appointments: [place] - [phone] [place] - [phone]        Podiatry  Appointments: [place] - [phone] [place] - [phone]  Pulmonology  Appointments: [place] - [phone]  Rheumatology  Appointments:  [place] - [phone]        [place] Location: [facility]  [address] , [place]  [address]  [place] Location: [facility] [address] [place], [place]  [address]  [place] Location: [facility] [address] [place], [place]  [address]    

## 2021-10-24 ENCOUNTER — Encounter
Admission: RE | Admit: 2021-10-24 | Discharge: 2021-10-24 | Disposition: A | Discharge status: Home / Self Care | Payer: Medicare HMO | Source: Ambulatory Visit | Attending: Orthopedic Surgery | Admitting: Orthopedic Surgery

## 2021-10-24 ENCOUNTER — Other Ambulatory Visit: Payer: Self-pay

## 2021-10-24 VITALS — BP 126/71 | HR 64 | Resp 18 | Ht 66.0 in | Wt 199.0 lb

## 2021-10-24 DIAGNOSIS — M1711 Unilateral primary osteoarthritis, right knee: Secondary | ICD-10-CM | POA: Insufficient documentation

## 2021-10-24 DIAGNOSIS — Z01818 Encounter for other preprocedural examination: Secondary | ICD-10-CM | POA: Diagnosis present

## 2021-10-24 DIAGNOSIS — R9431 Abnormal electrocardiogram [ECG] [EKG]: Secondary | ICD-10-CM | POA: Diagnosis not present

## 2021-10-24 DIAGNOSIS — I447 Left bundle-branch block, unspecified: Secondary | ICD-10-CM | POA: Diagnosis not present

## 2021-10-24 HISTORY — DX: COVID-19: U07.1

## 2021-10-24 LAB — COMPREHENSIVE METABOLIC PANEL
ALT: 19 U/L (ref 0–44)
AST: 22 U/L (ref 15–41)
Albumin: 4.8 g/dL (ref 3.5–5.0)
Alkaline Phosphatase: 45 U/L (ref 38–126)
Anion gap: 11 (ref 5–15)
BUN: 25 mg/dL — ABNORMAL HIGH (ref 8–23)
CO2: 27 mmol/L (ref 22–32)
Calcium: 10.1 mg/dL (ref 8.9–10.3)
Chloride: 99 mmol/L (ref 98–111)
Creatinine, Ser: 0.65 mg/dL (ref 0.44–1.00)
GFR, Estimated: >60 mL/min (ref >60)
Glucose, Bld: 93 mg/dL (ref 70–99)
Potassium: 4.4 mmol/L (ref 3.5–5.1)
Sodium: 137 mmol/L (ref 135–145)
Total Bilirubin: 0.9 mg/dL (ref 0.3–1.2)
Total Protein: 7.8 g/dL (ref 6.5–8.1)

## 2021-10-24 LAB — CBC
HCT: 40 % (ref 36.0–46.0)
Hemoglobin: 13 g/dL (ref 12.0–15.0)
MCH: 28.2 pg (ref 26.0–34.0)
MCHC: 32.5 g/dL (ref 30.0–36.0)
MCV: 86.8 fL (ref 80.0–100.0)
Platelets: 279 10*3/uL (ref 150–400)
RBC: 4.61 MIL/uL (ref 3.87–5.11)
RDW: 15.4 % (ref 11.5–15.5)
WBC: 6.6 10*3/uL (ref 4.0–10.5)
nRBC: 0 % (ref 0.0–0.2)

## 2021-10-24 LAB — URINALYSIS, ROUTINE W REFLEX MICROSCOPIC
Bilirubin Urine: NEGATIVE
Glucose, UA: NEGATIVE mg/dL
Hgb urine dipstick: NEGATIVE
Ketones, ur: NEGATIVE mg/dL
Leukocytes,Ua: NEGATIVE
Nitrite: NEGATIVE
Protein, ur: NEGATIVE mg/dL
Specific Gravity, Urine: 1.017 (ref 1.005–1.030)
pH: 5 (ref 5.0–8.0)

## 2021-10-24 LAB — TYPE AND SCREEN
ABO/RH(D): A POS
Antibody Screen: NEGATIVE

## 2021-10-24 LAB — C-REACTIVE PROTEIN: CRP: <0.5 mg/dL (ref <1.0)

## 2021-10-24 LAB — SURGICAL PCR SCREEN
MRSA, PCR: NEGATIVE
Staphylococcus aureus: NEGATIVE

## 2021-10-24 LAB — SEDIMENTATION RATE: Sed Rate: 13 mm/hr (ref 0–30)

## 2021-10-24 NOTE — Patient Instructions (Addendum)
Your procedure is scheduled on: 11/03/21 Report to [facility]. To find out your arrival time please call [phone] between 1PM - 3PM on 11/02/21 .  Remember: Instructions that are not followed completely may result in serious medical risk, up to and including death, or upon the discretion of your surgeon and anesthesiologist your surgery may need to be rescheduled.     _X__ 1. Do not eat food after midnight the night before your procedure.                 No gum chewing or hard candies. You may drink clear liquids up to 2 hours                 before you are scheduled to arrive for your surgery- DO not drink clear                 liquids within 2 hours of the start of your surgery.                 Clear Liquids include:  water, apple juice without pulp, clear carbohydrate                 drink such as Clearfast or Gatorade, Black Coffee or Tea (Do not add                 anything to coffee or tea). Diabetics water only  __X__2.  On the morning of surgery brush your teeth with toothpaste and water, you                 may rinse your mouth with mouthwash if you wish.  Do not swallow any              toothpaste of mouthwash.     _X__ 3.  No Alcohol for 24 hours before or after surgery.   _X__ 4.  Do Not Smoke or use e-cigarettes For 24 Hours Prior to Your Surgery.                 Do not use any chewable tobacco products for at least 6 hours prior to                 surgery.  ____  5.  Bring all medications with you on the day of surgery if instructed.   __X__  6.  Notify your doctor if there is any change in your medical condition      (cold, fever, infections).     Do not wear jewelry, make-up, hairpins, clips or nail polish. Do not wear lotions, powders, or perfumes. You may wear deodorant. Do not shave body hair 48 hours prior to surgery. Men may shave face and neck. Do not bring valuables to the hospital.    [facility] is  not responsible for any belongings or valuables.  Contacts, dentures/partials or body piercings may not be worn into surgery. Bring a case for your contacts, glasses or hearing aids, a denture cup will be supplied. Leave your suitcase in the car. After surgery it may be brought to your room. For patients admitted to the hospital, discharge time is determined by your treatment team.   Patients discharged the day of surgery will not be allowed to drive home.   Please read over the following fact sheets that you were given:   MRSA Information, CHG soap, Ensure, Incentive Spirometer  __X__ Take these medicines  the morning of surgery with A SIP OF WATER:    1. loratadine (CLARITIN) 10 MG tablet  2. mirabegron ER (MYRBETRIQ) 50 MG TB24 tablet  3.   4.  5.  6.  ____ Fleet Enema (as directed)   __X__ Use CHG Soap/SAGE wipes as directed  ____ Use inhalers on the day of surgery  ____ Stop metformin/Janumet/Farxiga 2 days prior to surgery    ____ Take 1/2 of usual insulin dose the night before surgery. No insulin the morning          of surgery.   ____ Stop Blood Thinners Coumadin/Plavix/Xarelto/Pleta/Pradaxa/Eliquis/Effient/Aspirin  on   Or contact your Surgeon, Cardiologist or Medical Doctor regarding  ability to stop your blood thinners  __X__ Stop Anti-inflammatories 7 days before surgery such as Advil, Ibuprofen, Motrin,  BC or Goodies Powder, Naprosyn, Naproxen, Aleve, Aspirin    __X__ Stop all herbals and supplements, fish oil or vitamins  until after surgery.    ____ Bring C-Pap to the hospital.

## 2021-10-25 NOTE — Progress Notes (Signed)
  Perioperative Services Pre-Admission/Anesthesia Testing    Date: 10/25/21  Name: Alexandria Carroll MRN:   [id number]  Re: LBBB on preoperative ECG  Planned Surgical Procedure(s):    Case: [id number] Date/Time: 11/03/21 1118   Procedure: COMPUTER ASSISTED TOTAL KNEE ARTHROPLASTY - RNFA (Right: Knee)   Anesthesia type: Choice   Pre-op diagnosis: PRIMARY OSTEOARTHRITIS OF RIGHT KNEE.   Location: [facility]   Surgeons: Dereck Leep, MD   Clinical Notes:  Patient is scheduled for the above procedure on 11/03/2021 with Dr. Skip Estimable, MD.  Preparation for procedure, patient presented to the PAT clinic on 10/24/2021 for interview, labs, and preoperative ECG.  In review of ECG tracing, patient noted to have a presumably new LBBB.  Of note, patient has no past views available for review and the [place].  Reached out to patient's PCP Sabra Heck, MD) to determine whether or not patient had a prior tracing and the [facility], however I was advised by the office that she did not.  ECG reviewed personally with medicine for input on findings noted on recent 12 lead.  Per Dr. Sabra Heck (internal medicine), "patient is healthy and as long as she is not experiencing any angina or anginal equivalent symptoms, patient should be safe to proceed with surgery as planned".  During interaction with patient during her PAT appointment yesterday, she indicated that she was feeling well overall with no reports of any concerning cardiovascular symptoms.  Honor Loh, MSN, APRN, FNP-C, CEN [facility]  Peri-operative Services Nurse Practitioner Phone: [phone] 10/25/21 9:18 AM  NOTE: This note has been prepared using Dragon dictation software. Despite my best ability to proofread, there is always the potential that unintentional transcriptional errors may still occur from this process.

## 2021-10-29 NOTE — H&P (Signed)
ORTHOPAEDIC HISTORY & PHYSICAL Gwenlyn Fudge, [place] - 10/26/2021 8:45 AM EDT Formatting of this note is different from the original. [place] [facility] Chief Complaint:   Chief Complaint  Patient presents with  Knee Pain  H & P RIGHT KNEE   History of Present Illness:   Alexandria Carroll is a 76 y.o. female that presents to clinic today for her preoperative history and evaluation. Patient presents unaccompanied. The patient is scheduled to undergo a right total knee arthroplasty on 11/03/21 by Dr. Marry Guan. Her pain began approximately 2 years ago. The pain is located primarily along the lateral and posterior aspects of the knee. She describes her pain as worse with weightbearing. She reports associated swelling of the knee. She denies associated numbness or tingling, denies locking or giving way of the knee.   The patient's symptoms have progressed to the point that they decrease her quality of life. The patient has previously undergone conservative treatment including NSAIDS and injections to the knee without adequate control of her symptoms.  Denies history of lumbar surgery, history of DVT, significant cardiac history. Patient will have her husband and daughter to assist at home post-operatively.   Patient would prefer to take Norco after surgery as she tolerated this well after foot surgery.   Past Medical, Surgical, Family, Social History, Allergies, Medications:   Past Medical History:  Past Medical History:  Diagnosis Date  Cervical disc disease 06/24/2013  C5-6  GERD (gastroesophageal reflux disease) 06/24/2013  HTN (hypertension) 06/24/2013  Lumbar disc disease 06/24/2013  L4-5, 5-S1   Past Surgical History:  Past Surgical History:  Procedure Laterality Date  COLONOSCOPY 01/22/2006  Adenomatous Polyp  COLONOSCOPY 02/01/2010  PH Adenomatous Polyp: CBF 01/2015; Recall Ltr mailed 01/01/2015 (dw)  COLONOSCOPY 11/02/2016  Tubular adenoma  of the colon/Repeat 81yr/TKT  FOOT SURGERY Left  TORN TENDONS  NASAL SEPTUM SURGERY  nasal surgery   Current Medications:  Current Outpatient Medications  Medication Sig Dispense Refill  acetaminophen (TYLENOL) 500 MG tablet Take 1,000 mg by mouth 2 (two) times daily  ascorbic acid, vitamin C, (VITAMIN C) 1000 MG tablet Take 2,000 mg by mouth at bedtime  bisoproloL-hydroCHLOROthiazide (ZIAC) 5-6.25 mg tablet Take 1 tablet by mouth once daily 90 tablet 3  cholecalciferol (VITAMIN D3) 5,000 unit capsule Take 1 capsule by mouth at bedtime  etodolac (LODINE) 400 MG tablet Take 1 tablet (400 mg total) by mouth 2 (two) times daily (Patient taking differently: Take 400 mg by mouth 2 (two) times daily as needed Has on hold.) 60 tablet 11  loratadine (CLARITIN) 10 mg tablet Take 10 mg by mouth once daily as needed for Allergies.  mirabegron (MYRBETRIQ) 50 mg ER tablet Take 1 tablet (50 mg total) by mouth once daily 30 tablet 2   No current facility-administered medications for this visit.   Allergies: No Known Allergies  Social History:  Social History   Socioeconomic History  Marital status: Married  Spouse name: MRonalee Belts Number of children: 1  Years of education: 12  Highest education level: High school graduate  Occupational History  Occupation: Retired- [employer] Tobacco Use  Smoking status: Never  Smokeless tobacco: Never  VSurveyor, miningUse: Never used  Substance and Sexual Activity  Alcohol use: No  Drug use: No  Sexual activity: Yes  Partners: Male  Social History Narrative  Feels safe in home.   Family History:  Family History  Problem Relation Age of Onset  Cancer Mother  High blood pressure (Hypertension) Mother  Ulcers Father  Alcohol abuse Brother  Cancer Brother   Review of Systems:   A 10+ ROS was performed, reviewed, and the pertinent orthopaedic findings are documented in the HPI.   Physical Examination:   BP (!)  140/80 (BP Location: Left upper arm, Patient Position: Sitting, BP Cuff Size: Large Adult)  Ht 163.8 cm (5' 4.5")  Wt 90.8 kg (200 lb 3.2 oz)  BMI 33.83 kg/m   Patient is a well-developed, well-nourished female in no acute distress. Patient has normal mood and affect. Patient is alert and oriented to person, place, and time.   HEENT: Atraumatic, normocephalic. Pupils equal and reactive to light. Extraocular motion intact. Noninjected sclera.  Cardiovascular: Regular rate and rhythm, with no murmurs, rubs, or gallops. Distal pulses palpable.  Respiratory: Lungs clear to auscultation bilaterally.   Right Knee: Soft tissue swelling: mild Effusion: none Erythema: none Crepitance: mild Tenderness: lateral Alignment: relative valgus Mediolateral laxity: lateral pseudolaxity Posterior sag: negative Patellar tracking: Good tracking without evidence of subluxation or tilt Atrophy: No significant atrophy.  Quadriceps tone was fair to good. Range of motion: 0/7/130 degrees   I would plantarflex and dorsiflex the right ankle. Able to flex and extend the toes.  Sensation intact over the saphenous, lateral sural cutaneous, superficial fibular, and deep fibular nerve distributions.  Tests Performed/Reviewed:  X-rays  Previous x-rays of the right knee were reviewed. Images show complete loss of lateral compartment joint space with bone-on-bone contact and subchondral sclerosis noted. No fractures or other osseous abnormalities noted.  Impression:   ICD-10-CM  1. Primary osteoarthritis of right knee M17.11   Plan:   The patient has end-stage degenerative changes of the right knee. It was explained to the patient that the condition is progressive in nature. Having failed conservative treatment, the patient has elected to proceed with a total joint arthroplasty. The patient will undergo a total joint arthroplasty with Dr. Marry Guan. The risks of surgery, including blood clot and infection, were  discussed with the patient. Measures to reduce these risks, including the use of anticoagulation, perioperative antibiotics, and early ambulation were discussed. The importance of postoperative physical therapy was discussed with the patient. The patient elects to proceed with surgery. The patient is instructed to stop all blood thinners prior to surgery. The patient is instructed to call the hospital the day before surgery to learn of the proper arrival time.   Contact our office with any questions or concerns. Follow up as indicated, or sooner should any new problems arise, if conditions worsen, or if they are otherwise concerned.   Gwenlyn Fudge, [facility] [place], [place] [address] Phone: [phone]  This note was generated in part with voice recognition software and I apologize for any typographical errors that were not detected and corrected.  Electronically signed by Gwenlyn Fudge, PA at 10/26/2021 1:16 PM EDT

## 2021-11-03 ENCOUNTER — Observation Stay: Payer: Medicare HMO

## 2021-11-03 ENCOUNTER — Encounter: Payer: Self-pay | Admitting: Orthopedic Surgery

## 2021-11-03 ENCOUNTER — Observation Stay
Admission: RE | Admit: 2021-11-03 | Discharge: 2021-11-04 | Disposition: A | Discharge status: Home Health | Payer: Medicare HMO | Attending: Orthopedic Surgery | Admitting: Orthopedic Surgery

## 2021-11-03 ENCOUNTER — Ambulatory Visit: Payer: Medicare HMO | Admitting: Urgent Care

## 2021-11-03 ENCOUNTER — Encounter
Admission: RE | Disposition: A | Discharge status: Home Health | Payer: Self-pay | Source: Home / Self Care | Attending: Orthopedic Surgery

## 2021-11-03 ENCOUNTER — Other Ambulatory Visit: Payer: Self-pay

## 2021-11-03 DIAGNOSIS — M1711 Unilateral primary osteoarthritis, right knee: Principal | ICD-10-CM | POA: Insufficient documentation

## 2021-11-03 DIAGNOSIS — I10 Essential (primary) hypertension: Secondary | ICD-10-CM | POA: Diagnosis not present

## 2021-11-03 DIAGNOSIS — Z8616 Personal history of COVID-19: Secondary | ICD-10-CM | POA: Insufficient documentation

## 2021-11-03 DIAGNOSIS — Z96659 Presence of unspecified artificial knee joint: Secondary | ICD-10-CM

## 2021-11-03 DIAGNOSIS — Z79899 Other long term (current) drug therapy: Secondary | ICD-10-CM | POA: Insufficient documentation

## 2021-11-03 HISTORY — PX: KNEE ARTHROPLASTY: SHX992

## 2021-11-03 LAB — ABO/RH: ABO/RH(D): A POS

## 2021-11-03 SURGERY — ARTHROPLASTY, KNEE, TOTAL, USING IMAGELESS COMPUTER-ASSISTED NAVIGATION
Anesthesia: Choice | Site: Knee | Laterality: Right

## 2021-11-03 MED ORDER — PROPOFOL 500 MG/50ML IV EMUL
INTRAVENOUS | Status: DC | PRN
  Administered 2021-11-03: 75 ug/kg/min via INTRAVENOUS

## 2021-11-03 MED ORDER — TRAMADOL HCL 50 MG PO TABS: 50.0000 mg | ORAL_TABLET | ORAL | Status: DC | PRN

## 2021-11-03 MED ORDER — ALUM & MAG HYDROXIDE-SIMETH 200-200-20 MG/5ML PO SUSP: 30.0000 mL | ORAL | Status: DC | PRN

## 2021-11-03 MED ORDER — MIDAZOLAM HCL 5 MG/5ML IJ SOLN
INTRAMUSCULAR | Status: DC | PRN
  Administered 2021-11-03: 2 mg via INTRAVENOUS

## 2021-11-03 MED ORDER — MIRABEGRON ER 50 MG PO TB24
50.0000 mg | ORAL_TABLET | Freq: Every day | ORAL | Status: DC
  Administered 2021-11-04: 50 mg via ORAL
  Filled 2021-11-03: qty 1

## 2021-11-03 MED ORDER — CEFAZOLIN SODIUM-DEXTROSE 2-4 GM/100ML-% IV SOLN
INTRAVENOUS | Status: AC
  Administered 2021-11-03: 2000 mg
  Filled 2021-11-03: qty 100

## 2021-11-03 MED ORDER — SURGIPHOR WOUND IRRIGATION SYSTEM - OPTIME
TOPICAL | Status: DC | PRN
  Administered 2021-11-03: 1 via TOPICAL

## 2021-11-03 MED ORDER — ENSURE PRE-SURGERY PO LIQD
296.0000 mL | Freq: Once | ORAL | Status: DC
  Filled 2021-11-03: qty 296

## 2021-11-03 MED ORDER — BISACODYL 10 MG RE SUPP: 10.0000 mg | Freq: Every day | RECTAL | Status: DC | PRN

## 2021-11-03 MED ORDER — VITAMIN D 25 MCG (1000 UNIT) PO TABS
5000.0000 [IU] | ORAL_TABLET | Freq: Every day | ORAL | Status: DC
  Administered 2021-11-03: 5000 [IU] via ORAL
  Filled 2021-11-03: qty 5

## 2021-11-03 MED ORDER — SODIUM CHLORIDE 0.9 % IV SOLN: INTRAVENOUS | Status: DC

## 2021-11-03 MED ORDER — HYDROMORPHONE HCL 1 MG/ML IJ SOLN: 0.5000 mg | INTRAMUSCULAR | Status: DC | PRN

## 2021-11-03 MED ORDER — TRANEXAMIC ACID-NACL 1000-0.7 MG/100ML-% IV SOLN
1000.0000 mg | INTRAVENOUS | Status: AC
  Administered 2021-11-03: 1000 mg via INTRAVENOUS

## 2021-11-03 MED ORDER — ENOXAPARIN SODIUM 30 MG/0.3ML IJ SOSY
30.0000 mg | PREFILLED_SYRINGE | Freq: Two times a day (BID) | INTRAMUSCULAR | Status: DC
  Administered 2021-11-04: 30 mg via SUBCUTANEOUS
  Filled 2021-11-03: qty 0.3

## 2021-11-03 MED ORDER — GABAPENTIN 300 MG PO CAPS
ORAL_CAPSULE | ORAL | Status: AC
  Administered 2021-11-03: 300 mg via ORAL
  Filled 2021-11-03: qty 1

## 2021-11-03 MED ORDER — BUPIVACAINE LIPOSOME 1.3 % IJ SUSP
INTRAMUSCULAR | Status: AC
  Filled 2021-11-03: qty 20

## 2021-11-03 MED ORDER — TRANEXAMIC ACID-NACL 1000-0.7 MG/100ML-% IV SOLN
1000.0000 mg | Freq: Once | INTRAVENOUS | Status: AC
  Administered 2021-11-03: 1000 mg via INTRAVENOUS

## 2021-11-03 MED ORDER — ACETAMINOPHEN 10 MG/ML IV SOLN
INTRAVENOUS | Status: DC | PRN
  Administered 2021-11-03: 1000 mg via INTRAVENOUS

## 2021-11-03 MED ORDER — FERROUS SULFATE 325 (65 FE) MG PO TABS
325.0000 mg | ORAL_TABLET | Freq: Two times a day (BID) | ORAL | Status: DC
  Administered 2021-11-03 – 2021-11-04 (×2): 325 mg via ORAL
  Filled 2021-11-03 (×2): qty 1

## 2021-11-03 MED ORDER — VITAMIN C 500 MG PO TABS
2000.0000 mg | ORAL_TABLET | Freq: Every day | ORAL | Status: DC
  Administered 2021-11-03: 2000 mg via ORAL
  Filled 2021-11-03: qty 4

## 2021-11-03 MED ORDER — ACETAMINOPHEN 325 MG PO TABS: 325.0000 mg | ORAL_TABLET | Freq: Four times a day (QID) | ORAL | Status: DC | PRN

## 2021-11-03 MED ORDER — CHLORHEXIDINE GLUCONATE 0.12 % MT SOLN: 15.0000 mL | Freq: Once | OROMUCOSAL | Status: AC

## 2021-11-03 MED ORDER — PANTOPRAZOLE SODIUM 40 MG PO TBEC
40.0000 mg | DELAYED_RELEASE_TABLET | Freq: Two times a day (BID) | ORAL | Status: DC
  Administered 2021-11-03 – 2021-11-04 (×2): 40 mg via ORAL
  Filled 2021-11-03 (×2): qty 1

## 2021-11-03 MED ORDER — CHLORHEXIDINE GLUCONATE 0.12 % MT SOLN
OROMUCOSAL | Status: AC
  Administered 2021-11-03: 15 mL via OROMUCOSAL
  Filled 2021-11-03: qty 15

## 2021-11-03 MED ORDER — PROPOFOL 1000 MG/100ML IV EMUL
INTRAVENOUS | Status: AC
  Filled 2021-11-03: qty 100

## 2021-11-03 MED ORDER — LACTATED RINGERS IV SOLN: INTRAVENOUS | Status: DC

## 2021-11-03 MED ORDER — ORAL CARE MOUTH RINSE: 15.0000 mL | Freq: Once | OROMUCOSAL | Status: AC

## 2021-11-03 MED ORDER — SODIUM CHLORIDE (PF) 0.9 % IJ SOLN
INTRAMUSCULAR | Status: DC | PRN
  Administered 2021-11-03: 120 mL

## 2021-11-03 MED ORDER — DEXAMETHASONE SODIUM PHOSPHATE 10 MG/ML IJ SOLN
INTRAMUSCULAR | Status: AC
  Administered 2021-11-03: 8 mg via INTRAVENOUS
  Filled 2021-11-03: qty 1

## 2021-11-03 MED ORDER — MAGNESIUM HYDROXIDE 400 MG/5ML PO SUSP
30.0000 mL | Freq: Every day | ORAL | Status: DC
  Administered 2021-11-03 – 2021-11-04 (×2): 30 mL via ORAL
  Filled 2021-11-03 (×2): qty 30

## 2021-11-03 MED ORDER — GABAPENTIN 300 MG PO CAPS: 300.0000 mg | ORAL_CAPSULE | Freq: Once | ORAL | Status: AC

## 2021-11-03 MED ORDER — MENTHOL 3 MG MT LOZG: 1.0000 | LOZENGE | OROMUCOSAL | Status: DC | PRN

## 2021-11-03 MED ORDER — ONDANSETRON HCL 4 MG PO TABS: 4.0000 mg | ORAL_TABLET | Freq: Four times a day (QID) | ORAL | Status: DC | PRN

## 2021-11-03 MED ORDER — SODIUM CHLORIDE 0.9 % IR SOLN
Status: DC | PRN
  Administered 2021-11-03: 3000 mL

## 2021-11-03 MED ORDER — CEFAZOLIN SODIUM-DEXTROSE 2-4 GM/100ML-% IV SOLN
INTRAVENOUS | Status: AC
  Administered 2021-11-03: 2 g via INTRAVENOUS
  Filled 2021-11-03: qty 100

## 2021-11-03 MED ORDER — BUPIVACAINE HCL (PF) 0.5 % IJ SOLN
INTRAMUSCULAR | Status: AC
  Filled 2021-11-03: qty 10

## 2021-11-03 MED ORDER — ONDANSETRON HCL 4 MG/2ML IJ SOLN
4.0000 mg | Freq: Four times a day (QID) | INTRAMUSCULAR | Status: DC | PRN
  Administered 2021-11-04: 4 mg via INTRAVENOUS
  Filled 2021-11-03: qty 2

## 2021-11-03 MED ORDER — PROPOFOL 10 MG/ML IV BOLUS
INTRAVENOUS | Status: AC
  Filled 2021-11-03: qty 20

## 2021-11-03 MED ORDER — DEXAMETHASONE SODIUM PHOSPHATE 10 MG/ML IJ SOLN: 8.0000 mg | Freq: Once | INTRAMUSCULAR | Status: AC

## 2021-11-03 MED ORDER — PHENOL 1.4 % MT LIQD: 1.0000 | OROMUCOSAL | Status: DC | PRN

## 2021-11-03 MED ORDER — CEFAZOLIN SODIUM-DEXTROSE 2-4 GM/100ML-% IV SOLN
2.0000 g | Freq: Four times a day (QID) | INTRAVENOUS | Status: AC
  Administered 2021-11-03: 2 g via INTRAVENOUS
  Filled 2021-11-03 (×4): qty 100

## 2021-11-03 MED ORDER — LORATADINE 10 MG PO TABS
10.0000 mg | ORAL_TABLET | Freq: Every day | ORAL | Status: DC
  Administered 2021-11-04: 10 mg via ORAL
  Filled 2021-11-03: qty 1

## 2021-11-03 MED ORDER — FLEET ENEMA 7-19 GM/118ML RE ENEM: 1.0000 | ENEMA | Freq: Once | RECTAL | Status: DC | PRN

## 2021-11-03 MED ORDER — CELECOXIB 200 MG PO CAPS
ORAL_CAPSULE | ORAL | Status: AC
  Administered 2021-11-03: 400 mg via ORAL
  Filled 2021-11-03: qty 2

## 2021-11-03 MED ORDER — FAMOTIDINE 20 MG PO TABS
ORAL_TABLET | ORAL | Status: AC
  Administered 2021-11-03: 20 mg via ORAL
  Filled 2021-11-03: qty 1

## 2021-11-03 MED ORDER — BISOPROLOL-HYDROCHLOROTHIAZIDE 5-6.25 MG PO TABS
1.0000 | ORAL_TABLET | Freq: Every day | ORAL | Status: DC
  Administered 2021-11-03: 1 via ORAL
  Filled 2021-11-03 (×2): qty 1

## 2021-11-03 MED ORDER — CELECOXIB 200 MG PO CAPS: 400.0000 mg | ORAL_CAPSULE | Freq: Once | ORAL | Status: AC

## 2021-11-03 MED ORDER — TRANEXAMIC ACID-NACL 1000-0.7 MG/100ML-% IV SOLN
INTRAVENOUS | Status: AC
  Filled 2021-11-03: qty 100

## 2021-11-03 MED ORDER — CELECOXIB 200 MG PO CAPS
200.0000 mg | ORAL_CAPSULE | Freq: Two times a day (BID) | ORAL | Status: DC
  Administered 2021-11-03 – 2021-11-04 (×2): 200 mg via ORAL
  Filled 2021-11-03 (×2): qty 1

## 2021-11-03 MED ORDER — METOCLOPRAMIDE HCL 10 MG PO TABS
10.0000 mg | ORAL_TABLET | Freq: Three times a day (TID) | ORAL | Status: DC
  Administered 2021-11-03 – 2021-11-04 (×3): 10 mg via ORAL
  Filled 2021-11-03 (×7): qty 1

## 2021-11-03 MED ORDER — OXYCODONE HCL 5 MG PO TABS
10.0000 mg | ORAL_TABLET | ORAL | Status: DC | PRN
  Administered 2021-11-03 – 2021-11-04 (×3): 10 mg via ORAL
  Filled 2021-11-03 (×3): qty 2

## 2021-11-03 MED ORDER — ACETAMINOPHEN 10 MG/ML IV SOLN
INTRAVENOUS | Status: AC
  Filled 2021-11-03: qty 100

## 2021-11-03 MED ORDER — OXYCODONE HCL 5 MG PO TABS: 5.0000 mg | ORAL_TABLET | ORAL | Status: DC | PRN

## 2021-11-03 MED ORDER — ACETAMINOPHEN 10 MG/ML IV SOLN
1000.0000 mg | Freq: Four times a day (QID) | INTRAVENOUS | Status: DC
  Administered 2021-11-03 – 2021-11-04 (×3): 1000 mg via INTRAVENOUS
  Filled 2021-11-03 (×3): qty 100

## 2021-11-03 MED ORDER — SENNOSIDES-DOCUSATE SODIUM 8.6-50 MG PO TABS
1.0000 | ORAL_TABLET | Freq: Two times a day (BID) | ORAL | Status: DC
  Administered 2021-11-03 – 2021-11-04 (×2): 1 via ORAL
  Filled 2021-11-03 (×2): qty 1

## 2021-11-03 MED ORDER — CEFAZOLIN SODIUM-DEXTROSE 2-4 GM/100ML-% IV SOLN
2.0000 g | INTRAVENOUS | Status: AC
  Administered 2021-11-03: 2 g via INTRAVENOUS

## 2021-11-03 MED ORDER — FAMOTIDINE 20 MG PO TABS: 20.0000 mg | ORAL_TABLET | Freq: Once | ORAL | Status: AC

## 2021-11-03 MED ORDER — DIPHENHYDRAMINE HCL 12.5 MG/5ML PO ELIX: 12.5000 mg | ORAL_SOLUTION | ORAL | Status: DC | PRN

## 2021-11-03 MED ORDER — BUPIVACAINE HCL (PF) 0.5 % IJ SOLN
INTRAMUSCULAR | Status: DC | PRN
  Administered 2021-11-03: 3 mL

## 2021-11-03 MED ORDER — CHLORHEXIDINE GLUCONATE 4 % EX LIQD: 60.0000 mL | Freq: Once | CUTANEOUS | Status: DC

## 2021-11-03 MED ORDER — MIDAZOLAM HCL 2 MG/2ML IJ SOLN
INTRAMUSCULAR | Status: AC
  Filled 2021-11-03: qty 2

## 2021-11-03 SURGICAL SUPPLY — 66 items
ATTUNE MED DOME PAT 32 KNEE (Knees) IMPLANT
ATTUNE PSFEM RTSZ4 NARCEM KNEE (Femur) IMPLANT
ATTUNE PSRP INSR SZ4 5 KNEE (Insert) IMPLANT
BASEPLATE TIBIAL ROTATING SZ 4 (Knees) IMPLANT
BATTERY INSTRU NAVIGATION (MISCELLANEOUS) ×4 IMPLANT
BLADE SAW 70X12.5 (BLADE) ×1 IMPLANT
BLADE SAW 90X13X1.19 OSCILLAT (BLADE) ×1 IMPLANT
BLADE SAW 90X25X1.19 OSCILLAT (BLADE) ×1 IMPLANT
CEMENT HV SMART SET (Cement) IMPLANT
COOLER POLAR GLACIER W/PUMP (MISCELLANEOUS) ×1 IMPLANT
CUFF TOURN SGL QUICK 34 (TOURNIQUET CUFF)
CUFF TRNQT CYL 34X4.125X (TOURNIQUET CUFF) IMPLANT
DRAPE 3/4 80X56 (DRAPES) ×1 IMPLANT
DRSG DERMACEA NONADH 3X8 (GAUZE/BANDAGES/DRESSINGS) ×1 IMPLANT
DRSG MEPILEX SACRM 8.7X9.8 (GAUZE/BANDAGES/DRESSINGS) ×1 IMPLANT
DRSG OPSITE POSTOP 4X14 (GAUZE/BANDAGES/DRESSINGS) ×1 IMPLANT
DRSG TEGADERM 4X4.75 (GAUZE/BANDAGES/DRESSINGS) ×1 IMPLANT
DURAPREP 26ML APPLICATOR (WOUND CARE) ×2 IMPLANT
ELECT CAUTERY BLADE 6.4 (BLADE) ×1 IMPLANT
ELECT REM PT RETURN 9FT ADLT (ELECTROSURGICAL) ×1
ELECTRODE REM PT RTRN 9FT ADLT (ELECTROSURGICAL) ×1 IMPLANT
EX-PIN ORTHOLOCK NAV 4X150 (PIN) ×2 IMPLANT
GLOVE BIO SURGEON STRL SZ7 (GLOVE) ×2 IMPLANT
GLOVE BIOGEL M STRL SZ7.5 (GLOVE) ×2 IMPLANT
GLOVE BIOGEL PI IND STRL 8 (GLOVE) ×1 IMPLANT
GLOVE PI ORTHO PRO STRL 7.5 (GLOVE) ×2 IMPLANT
GLOVE SURG UNDER POLY LF SZ7.5 (GLOVE) ×1 IMPLANT
GOWN STRL REUS W/ TWL LRG LVL3 (GOWN DISPOSABLE) ×2 IMPLANT
GOWN STRL REUS W/ TWL XL LVL3 (GOWN DISPOSABLE) ×1 IMPLANT
GOWN STRL REUS W/TWL LRG LVL3 (GOWN DISPOSABLE) ×2
GOWN STRL REUS W/TWL XL LVL3 (GOWN DISPOSABLE) ×1
HEMOVAC 400CC 10FR (MISCELLANEOUS) ×1 IMPLANT
HOLDER FOLEY CATH W/STRAP (MISCELLANEOUS) ×1 IMPLANT
HOOD PEEL AWAY FLYTE STAYCOOL (MISCELLANEOUS) ×2 IMPLANT
IV NS IRRIG 3000ML ARTHROMATIC (IV SOLUTION) ×1 IMPLANT
KIT TURNOVER KIT A (KITS) ×1 IMPLANT
KNIFE SCULPS 14X20 (INSTRUMENTS) ×1 IMPLANT
MANIFOLD NEPTUNE II (INSTRUMENTS) ×2 IMPLANT
NDL SPNL 20GX3.5 QUINCKE YW (NEEDLE) ×2 IMPLANT
NEEDLE SPNL 20GX3.5 QUINCKE YW (NEEDLE) ×2 IMPLANT
PACK TOTAL KNEE (MISCELLANEOUS) ×1 IMPLANT
PAD ABD DERMACEA PRESS 5X9 (GAUZE/BANDAGES/DRESSINGS) ×2 IMPLANT
PAD WRAPON POLAR KNEE (MISCELLANEOUS) ×1 IMPLANT
PIN DRILL FIX HALF THREAD (BIT) ×2 IMPLANT
PIN DRILL QUICK PACK ×2 IMPLANT
PIN FIXATION 1/8DIA X 3INL (PIN) ×1 IMPLANT
PULSAVAC PLUS IRRIG FAN TIP (DISPOSABLE) ×1
SOL PREP PVP 2OZ (MISCELLANEOUS) ×1
SOLUTION IRRIG SURGIPHOR (IV SOLUTION) ×1 IMPLANT
SOLUTION PREP PVP 2OZ (MISCELLANEOUS) ×1 IMPLANT
SPONGE DRAIN TRACH 4X4 STRL 2S (GAUZE/BANDAGES/DRESSINGS) ×1 IMPLANT
STAPLER SKIN PROX 35W (STAPLE) ×1 IMPLANT
STOCKINETTE IMPERV 14X48 (MISCELLANEOUS) IMPLANT
STRAP TIBIA SHORT (MISCELLANEOUS) ×1 IMPLANT
SUCTION FRAZIER HANDLE 10FR (MISCELLANEOUS) ×1
SUCTION TUBE FRAZIER 10FR DISP (MISCELLANEOUS) ×1 IMPLANT
SUT VIC AB 0 CT1 36 (SUTURE) ×2 IMPLANT
SUT VIC AB 1 CT1 36 (SUTURE) ×2 IMPLANT
SUT VIC AB 2-0 CT2 27 (SUTURE) ×1 IMPLANT
SYR 30ML LL (SYRINGE) ×2 IMPLANT
TIP FAN IRRIG PULSAVAC PLUS (DISPOSABLE) ×1 IMPLANT
TOWER CARTRIDGE SMART MIX (DISPOSABLE) ×1 IMPLANT
TRAP FLUID SMOKE EVACUATOR (MISCELLANEOUS) ×1 IMPLANT
TRAY FOLEY MTR SLVR 16FR STAT (SET/KITS/TRAYS/PACK) ×1 IMPLANT
WATER STERILE IRR 1000ML POUR (IV SOLUTION) ×1 IMPLANT
WRAPON POLAR PAD KNEE (MISCELLANEOUS) ×1

## 2021-11-03 NOTE — Anesthesia Postprocedure Evaluation (Signed)
Anesthesia Post Note  Patient: Alexandria Carroll  Procedure(s) Performed: COMPUTER ASSISTED TOTAL KNEE ARTHROPLASTY - RNFA (Right: Knee)  Patient location during evaluation: PACU Anesthesia Type: Combined General/Spinal Level of consciousness: awake and alert Pain management: pain level controlled Vital Signs Assessment: post-procedure vital signs reviewed and stable Respiratory status: spontaneous breathing, nonlabored ventilation, respiratory function stable and patient connected to nasal cannula oxygen Cardiovascular status: blood pressure returned to baseline and stable Postop Assessment: no apparent nausea or vomiting Anesthetic complications: no   No notable events documented.   Last Vitals:  Vitals:   11/03/21 1419 11/03/21 1430  BP: 103/62 (!) 94/54  Pulse: 74 70  Resp: 15 18  Temp: 36.8 C   SpO2: 96% 94%    Last Pain:  Vitals:   11/03/21 1419  TempSrc:   PainSc: 0-No pain                 Ilene Qua

## 2021-11-03 NOTE — Anesthesia Preprocedure Evaluation (Addendum)
Anesthesia Evaluation  Patient identified by MRN, date of birth, ID band Patient awake    Reviewed: Allergy & Precautions, NPO status , Patient's Chart, lab work & pertinent test results  History of Anesthesia Complications (+) PONV and history of anesthetic complications  Airway Mallampati: III  TM Distance: >3 FB Neck ROM: full    Dental  (+) Teeth Intact   Pulmonary neg pulmonary ROS,    Pulmonary exam normal        Cardiovascular hypertension, On Medications negative cardio ROS Normal cardiovascular exam     Neuro/Psych negative neurological ROS  negative psych ROS   GI/Hepatic Neg liver ROS, GERD  ,  Endo/Other  negative endocrine ROS  Renal/GU negative Renal ROS  negative genitourinary   Musculoskeletal   Abdominal   Peds  Hematology negative hematology ROS (+)   Anesthesia Other Findings Past Medical History: No date: COVID     Comment:  mild No date: GERD (gastroesophageal reflux disease) No date: Hypertension No date: Multilevel degenerative disc disease     Comment:  cervical, lumbar No date: PONV (postoperative nausea and vomiting) 10/2019: Vertigo     Comment:  Eppley manuever helped No date: Wears contact lenses  Past Surgical History: No date: COLONOSCOPY No date: DILATION AND CURETTAGE OF UTERUS No date: NOSE SURGERY 02/04/2020: TENDON REPAIR; Left     Comment:  Procedure: FLEXOR TENDON REPAIR - SECOND LEFT;  Surgeon:              Caroline More, DPM;  Location: [facility];                Service: Podiatry;  Laterality: Left;     Reproductive/Obstetrics negative OB ROS                             Anesthesia Physical Anesthesia Plan  ASA: 2  Anesthesia Plan: General/Spinal   Post-op Pain Management:    Induction: Intravenous  PONV Risk Score and Plan: 4 or greater and Propofol infusion and TIVA  Airway Management Planned: Natural Airway and  Nasal Cannula  Additional Equipment:   Intra-op Plan:   Post-operative Plan:   Informed Consent: I have reviewed the patients History and Physical, chart, labs and discussed the procedure including the risks, benefits and alternatives for the proposed anesthesia with the patient or authorized representative who has indicated his/her understanding and acceptance.     Dental Advisory Given  Plan Discussed with: Anesthesiologist, CRNA and Surgeon  Anesthesia Plan Comments: (Patient reports no bleeding problems and no anticoagulant use.  Plan for spinal with backup GA  Patient consented for risks of anesthesia including but not limited to:  - adverse reactions to medications - damage to eyes, teeth, lips or other oral mucosa - nerve damage due to positioning  - risk of bleeding, infection and or nerve damage from spinal that could lead to paralysis - risk of headache or failed spinal - damage to teeth, lips or other oral mucosa - sore throat or hoarseness - damage to heart, brain, nerves, lungs, other parts of body or loss of life  Patient voiced understanding.)      Anesthesia Quick Evaluation

## 2021-11-03 NOTE — Op Note (Signed)
OPERATIVE NOTE  DATE OF SURGERY:  11/03/2021  PATIENT NAME:  Alexandria Carroll   DOB: [birth year]  MRN: [id number]  PRE-OPERATIVE DIAGNOSIS: Degenerative arthrosis of the right knee, primary  POST-OPERATIVE DIAGNOSIS:  Same  PROCEDURE:  Right total knee arthroplasty using computer-assisted navigation  SURGEON:  Marciano Sequin. M.D.  ANESTHESIA: spinal  ESTIMATED BLOOD LOSS: 50 mL  FLUIDS REPLACED: 1000 mL of crystalloid  TOURNIQUET TIME: 77 minutes  DRAINS: 2 medium Hemovac drains  SOFT TISSUE RELEASES: Anterior cruciate ligament, posterior cruciate ligament, deep medial collateral ligament, patellofemoral ligament  IMPLANTS UTILIZED: DePuy Attune size 4N posterior stabilized femoral component (cemented), size 4 rotating platform tibial component (cemented), 32 mm medialized dome patella (cemented), and a 5 mm stabilized rotating platform polyethylene insert.  INDICATIONS FOR SURGERY: Alexandria Carroll is a 76 y.o. year old female with a long history of progressive knee pain. X-rays demonstrated severe degenerative changes in tricompartmental fashion. The patient had not seen any significant improvement despite conservative nonsurgical intervention. After discussion of the risks and benefits of surgical intervention, the patient expressed understanding of the risks benefits and agree with plans for total knee arthroplasty.   The risks, benefits, and alternatives were discussed at length including but not limited to the risks of infection, bleeding, nerve injury, stiffness, blood clots, the need for revision surgery, cardiopulmonary complications, among others, and they were willing to proceed.  PROCEDURE IN DETAIL: The patient was brought into the operating room and, after adequate spinal anesthesia was achieved, a tourniquet was placed on the patient's upper thigh. The patient's knee and leg were cleaned and prepped with alcohol and DuraPrep and draped in the usual sterile fashion. A  "timeout" was performed as per usual protocol. The lower extremity was exsanguinated using an Esmarch, and the tourniquet was inflated to 300 mmHg. An anterior longitudinal incision was made followed by a standard mid vastus approach. The deep fibers of the medial collateral ligament were elevated in a subperiosteal fashion off of the medial flare of the tibia so as to maintain a continuous soft tissue sleeve. The patella was subluxed laterally and the patellofemoral ligament was incised. Inspection of the knee demonstrated severe degenerative changes with full-thickness loss of articular cartilage. Osteophytes were debrided using a rongeur. Anterior and posterior cruciate ligaments were excised. Two 4.0 mm Schanz pins were inserted in the femur and into the tibia for attachment of the array of trackers used for computer-assisted navigation. Hip center was identified using a circumduction technique. Distal landmarks were mapped using the computer. The distal femur and proximal tibia were mapped using the computer. The distal femoral cutting guide was positioned using computer-assisted navigation so as to achieve a 5 distal valgus cut. The femur was sized and it was felt that a size 4N femoral component was appropriate. A size 4 femoral cutting guide was positioned and the anterior cut was performed and verified using the computer. This was followed by completion of the posterior and chamfer cuts. Femoral cutting guide for the central box was then positioned in the center box cut was performed.  Attention was then directed to the proximal tibia. Medial and lateral menisci were excised. The extramedullary tibial cutting guide was positioned using computer-assisted navigation so as to achieve a 0 varus-valgus alignment and 3 posterior slope. The cut was performed and verified using the computer. The proximal tibia was sized and it was felt that a size 4 tibial tray was appropriate. Tibial and femoral trials were  inserted followed  by insertion of a 5 mm polyethylene insert. This allowed for excellent mediolateral soft tissue balancing both in flexion and in full extension. Finally, the patella was cut and prepared so as to accommodate a 32 mm medialized dome patella. A patella trial was placed and the knee was placed through a range of motion with excellent patellar tracking appreciated. The femoral trial was removed after debridement of posterior osteophytes. The central post-hole for the tibial component was reamed followed by insertion of a keel punch. Tibial trials were then removed. Cut surfaces of bone were irrigated with copious amounts of normal saline using pulsatile lavage and then suctioned dry. Polymethylmethacrylate cement was prepared in the usual fashion using a vacuum mixer. Cement was applied to the cut surface of the proximal tibia as well as along the undersurface of a size 4 rotating platform tibial component. Tibial component was positioned and impacted into place. Excess cement was removed using [employer] fast streamer. Cement was then applied to the cut surfaces of the femur as well as along the posterior flanges of the size 4N femoral component. The femoral component was positioned and impacted into place. Excess cement was removed using [employer] fast streamer. A 5 mm polyethylene trial was inserted and the knee was brought into full extension with steady axial compression applied. Finally, cement was applied to the backside of a 32 mm medialized dome patella and the patellar component was positioned and patellar clamp applied. Excess cement was removed using [employer] fast streamer. After adequate curing of the cement, the tourniquet was deflated after a total tourniquet time of 77 minutes. Hemostasis was achieved using electrocautery. The knee was irrigated with copious amounts of normal saline using pulsatile lavage followed by 450 ml of Surgiphor and then suctioned dry. 20 mL of 1.3% Exparel and 60 mL of 0.25% Marcaine  in 40 mL of normal saline was injected along the posterior capsule, medial and lateral gutters, and along the arthrotomy site. A 5 mm stabilized rotating platform polyethylene insert was inserted and the knee was placed through a range of motion with excellent mediolateral soft tissue balancing appreciated and excellent patellar tracking noted. 2 medium drains were placed in the wound bed and brought out through separate stab incisions. The medial parapatellar portion of the incision was reapproximated using interrupted sutures of #1 Vicryl. Subcutaneous tissue was approximated in layers using first #0 Vicryl followed #2-0 Vicryl. The skin was approximated with skin staples. A sterile dressing was applied.  The patient tolerated the procedure well and was transported to the recovery room in stable condition.    Leyla Soliz P. Holley Bouche., M.D.

## 2021-11-03 NOTE — Anesthesia Procedure Notes (Signed)
Spinal  Patient location during procedure: OR Start time: 11/03/2021 11:02 AM End time: 11/03/2021 11:11 AM Reason for block: surgical anesthesia Staffing Anesthesiologist: Ilene Qua, MD Resident/CRNA: Fredderick Phenix, CRNA Performed by: Fredderick Phenix, CRNA Authorized by: Ilene Qua, MD   Preanesthetic Checklist Completed: patient identified, IV checked, site marked, risks and benefits discussed, surgical consent, monitors and equipment checked, pre-op evaluation and timeout performed Spinal Block Patient position: sitting Prep: DuraPrep Patient monitoring: heart rate, cardiac monitor, continuous pulse ox and blood pressure Approach: midline Location: L3-4 Injection technique: single-shot Needle Needle type: Sprotte  Needle gauge: 24 G Needle length: 9 cm Assessment Sensory level: T4 Events: CSF return

## 2021-11-03 NOTE — Transfer of Care (Signed)
Immediate Anesthesia Transfer of Care Note  Patient: Alexandria Carroll  Procedure(s) Performed: COMPUTER ASSISTED TOTAL KNEE ARTHROPLASTY - RNFA (Right: Knee)  Patient Location: PACU  Anesthesia Type:Spinal  Level of Consciousness: awake, alert  and oriented  Airway & Oxygen Therapy: Patient Spontanous Breathing  Post-op Assessment: Report given to RN and Post -op Vital signs reviewed and stable  Post vital signs: Reviewed and stable  Last Vitals:  Vitals Value Taken Time  BP 103/62 11/03/21 1419  Temp    Pulse 74 11/03/21 1423  Resp 13 11/03/21 1423  SpO2 96 % 11/03/21 1423  Vitals shown include unvalidated device data.  Last Pain:  Vitals:   11/03/21 1026  TempSrc: Temporal  PainSc: 0-No pain         Complications: No notable events documented.

## 2021-11-03 NOTE — Interval H&P Note (Signed)
History and Physical Interval Note:  11/03/2021 10:39 AM  Alexandria Carroll  has presented today for surgery, with the diagnosis of PRIMARY OSTEOARTHRITIS OF RIGHT KNEE..  The various methods of treatment have been discussed with the patient and family. After consideration of risks, benefits and other options for treatment, the patient has consented to  Procedure(s): COMPUTER ASSISTED TOTAL KNEE ARTHROPLASTY - RNFA (Right) as a surgical intervention.  The patient's history has been reviewed, patient examined, no change in status, stable for surgery.  I have reviewed the patient's chart and labs.  Questions were answered to the patient's satisfaction.     [place]

## 2021-11-03 NOTE — Progress Notes (Signed)
PT Cancellation Note  Patient Details Name: Alexandria Carroll MRN: [id number] DOB: Feb 06, 1945   Cancelled Treatment:    Reason Eval/Treat Not Completed: Other (comment) Per nursing pt not yet appropriate for participation with PT services.  Will attempt to see pt at a future date as medically appropriate.     Linus Salmons PT, DPT 11/03/21, 4:56 PM

## 2021-11-04 DIAGNOSIS — M1711 Unilateral primary osteoarthritis, right knee: Secondary | ICD-10-CM | POA: Diagnosis not present

## 2021-11-04 MED ORDER — ENOXAPARIN SODIUM 40 MG/0.4ML IJ SOSY: 40.0000 mg | PREFILLED_SYRINGE | INTRAMUSCULAR | 0 refills | Status: AC

## 2021-11-04 MED ORDER — TRAMADOL HCL 50 MG PO TABS: 50.0000 mg | ORAL_TABLET | ORAL | 0 refills | Status: AC | PRN

## 2021-11-04 MED ORDER — SENNOSIDES-DOCUSATE SODIUM 8.6-50 MG PO TABS: 1.0000 | ORAL_TABLET | Freq: Two times a day (BID) | ORAL | Status: AC

## 2021-11-04 MED ORDER — OXYCODONE HCL 5 MG PO TABS: 5.0000 mg | ORAL_TABLET | ORAL | 0 refills | Status: AC | PRN

## 2021-11-04 MED ORDER — CELECOXIB 200 MG PO CAPS: 200.0000 mg | ORAL_CAPSULE | Freq: Two times a day (BID) | ORAL | 0 refills | Status: AC

## 2021-11-04 NOTE — Plan of Care (Signed)
°  Problem: Education: °Goal: Knowledge of the prescribed therapeutic regimen will improve °Outcome: Adequate for Discharge °Goal: Individualized Educational Video(s) °Outcome: Adequate for Discharge °  °Problem: Activity: °Goal: Ability to avoid complications of mobility impairment will improve °Outcome: Adequate for Discharge °Goal: Range of joint motion will improve °Outcome: Adequate for Discharge °  °Problem: Clinical Measurements: °Goal: Postoperative complications will be avoided or minimized °Outcome: Adequate for Discharge °  °Problem: Pain Management: °Goal: Pain level will decrease with appropriate interventions °Outcome: Adequate for Discharge °  °Problem: Skin Integrity: °Goal: Will show signs of wound healing °Outcome: Adequate for Discharge °  °

## 2021-11-04 NOTE — TOC Progression Note (Signed)
Transition of Care [facility]) - Progression Note    Patient Details  Name: Alexandria Carroll MRN: [id number] Date of Birth: August 07, 1945  Transition [facility] Care [facility]) CM/SW [place], [place] Phone Number: 11/04/2021, 4:21 PM  Clinical Narrative:     CSW reached back out to [employer] for an update on what time pt's DME would be delivered, Jasmine states DME is in route and should arrive within the hour, states she will reach out to the technician for an update.        Expected Discharge Plan and Services           Expected Discharge Date: 11/04/21                                     Social Determinants of Health (SDOH) Interventions    Readmission Risk Interventions     No data to display

## 2021-11-04 NOTE — TOC Initial Note (Signed)
Transition of Care [facility]) - Initial/Assessment Note    Patient Details  Name: Alexandria Carroll MRN: [id number] Date of Birth: 04-14-1945  Transition of Care [facility]) CM/SW Contact:    [address], [place] Phone Number: 11/04/2021, 2:04 PM  Clinical Narrative:                  CSW received request from RN to check on status of pt DME-3 in 1, spoke to Sanford from [place], states will be delivered today, RN made aware.       Patient Goals and CMS Choice        Expected Discharge Plan and Services           Expected Discharge Date: 11/04/21                                    Prior Living Arrangements/Services                       Activities of Daily Living Home Assistive Devices/Equipment: Gilford Rile (specify type) (needs Bedside commode 3-1) ADL Screening (condition at time of admission) Patient's cognitive ability adequate to safely complete daily activities?: Yes Is the patient deaf or have difficulty hearing?: No Does the patient have difficulty seeing, even when wearing glasses/contacts?: No Does the patient have difficulty concentrating, remembering, or making decisions?: No Patient able to express need for assistance with ADLs?: Yes Does the patient have difficulty dressing or bathing?: No Independently performs ADLs?: Yes (appropriate for developmental age) Does the patient have difficulty walking or climbing stairs?: Yes Weakness of Legs: Right Weakness of Arms/Hands: None  Permission Sought/Granted                  Emotional Assessment              Admission diagnosis:  Total knee replacement status [Z96.659] Patient Active Problem List   Diagnosis Date Noted   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

## 2021-11-04 NOTE — Discharge Summary (Signed)
Physician Discharge Summary  Patient ID: Alexandria Carroll MRN: [id number] DOB/AGE: [birth year] 74 y.o.  Admit date: 11/03/2021 Discharge date: 11/04/2021  Admission Diagnoses:  Total knee replacement status [Z96.659]   Discharge Diagnoses: Patient Active Problem List   Diagnosis Date Noted   Total knee replacement status 11/03/2021   Primary osteoarthritis of right knee 10/01/2021   Tubular adenoma 10/01/2017   Cervical disc disease 06/24/2013   Lumbar disc disease 06/24/2013   HTN (hypertension) 06/24/2013    Past Medical History:  Diagnosis Date   COVID    mild   GERD (gastroesophageal reflux disease)    Hypertension    Multilevel degenerative disc disease    cervical, lumbar   PONV (postoperative nausea and vomiting)    Vertigo 10/2019   Eppley manuever helped   Wears contact lenses      Transfusion: None   Consultants (if any):   Discharged Condition: Improved  Hospital Course: Alexandria Carroll is an 76 y.o. female who was admitted 11/03/2021 with a diagnosis of Total knee replacement status and went to the operating room on 11/03/2021 and underwent the above named procedures.    Surgeries: Procedure(s): COMPUTER ASSISTED TOTAL KNEE ARTHROPLASTY - RNFA on 11/03/2021 Patient tolerated the surgery well. Taken to PACU where she was stabilized and then transferred to the orthopedic floor.  Started on Lovenox 30 mg q 12 hrs. TEDs and SCDs applied bilaterally. Heels elevated on bed. No evidence of DVT. Negative [place]oman. Physical therapy started on day #1 for gait training and transfer. OT started day #1 for ADL and assisted devices. Patient's IV and hemovac was d/c on day #1.  On postop day 1 patient completed all physical therapy goals.  She was able to safely and independently ambulate around the nurses station and walk up and down stairs.  PT agreeable to discharge to home with home health PT.  On postop day 1 patient medically stable and cleared for discharge to home  with home health PT   Implants: DePuy Attune size 4N posterior stabilized femoral component (cemented), size 4 rotating platform tibial component (cemented), 32 mm medialized dome patella (cemented), and a 5 mm stabilized rotating platform polyethylene insert.    She was given perioperative antibiotics:  Anti-infectives (From admission, onward)    Start     Dose/Rate Route Frequency Ordered Stop   11/03/21 1700  ceFAZolin (ANCEF) IVPB 2g/100 mL premix        2 g 200 mL/hr over 30 Minutes Intravenous Every 6 hours 11/03/21 1412 11/03/21 2348   11/03/21 1637  ceFAZolin (ANCEF) 2-4 GM/100ML-% IVPB       Note to Pharmacy: Sharmon Leyden A: cabinet override      11/03/21 1637 11/03/21 1750   11/03/21 1011  ceFAZolin (ANCEF) 2-4 GM/100ML-% IVPB       Note to Pharmacy: Olena Mater F: cabinet override      11/03/21 1011 11/03/21 1727   11/03/21 0600  ceFAZolin (ANCEF) IVPB 2g/100 mL premix        2 g 200 mL/hr over 30 Minutes Intravenous On call to O.R. 11/03/21 0124 11/03/21 1116     .  She was given sequential compression devices, early ambulation, and Lovenox, teds for DVT prophylaxis.  She benefited maximally from the hospital stay and there were no complications.    Recent vital signs:  Vitals:   11/04/21 0333 11/04/21 0724  BP: 129/70 129/64  Pulse: 71 67  Resp: 16 17  Temp: 97.9 F (36.6 C) 97.7  F (36.5 C)  SpO2: 98% 98%    Recent laboratory studies:  Lab Results  Component Value Date   HGB 13.0 10/24/2021   Lab Results  Component Value Date   WBC 6.6 10/24/2021   PLT 279 10/24/2021   No results found for: "INR" Lab Results  Component Value Date   NA 137 10/24/2021   K 4.4 10/24/2021   CL 99 10/24/2021   CO2 27 10/24/2021   BUN 25 (H) 10/24/2021   CREATININE 0.65 10/24/2021   GLUCOSE 93 10/24/2021    Discharge Medications:   Allergies as of 11/04/2021   No Known Allergies      Medication List     STOP taking these medications     etodolac 400 MG tablet Commonly known as: LODINE       TAKE these medications    bisoprolol-hydrochlorothiazide 5-6.25 MG tablet Commonly known as: ZIAC Take 1 tablet by mouth daily.   BLUE-EMU MAXIMUM STRENGTH EX Apply 1 applicator topically as needed (for muscle pain).   celecoxib 200 MG capsule Commonly known as: CELEBREX Take 1 capsule (200 mg total) by mouth 2 (two) times daily.   enoxaparin 40 MG/0.4ML injection Commonly known as: LOVENOX Inject 0.4 mLs (40 mg total) into the skin daily for 14 days.   loratadine 10 MG tablet Commonly known as: CLARITIN Take 10 mg by mouth daily.   Myrbetriq 50 MG Tb24 tablet Generic drug: mirabegron ER Take 50 mg by mouth daily.   oxyCODONE 5 MG immediate release tablet Commonly known as: Oxy IR/ROXICODONE Take 1 tablet (5 mg total) by mouth every 4 (four) hours as needed for moderate pain (pain score 4-6).   senna-docusate 8.6-50 MG tablet Commonly known as: Senokot-S Take 1 tablet by mouth 2 (two) times daily.   traMADol 50 MG tablet Commonly known as: ULTRAM Take 1-2 tablets (50-100 mg total) by mouth every 4 (four) hours as needed for moderate pain.   Tylenol 8 Hour 650 MG CR tablet Generic drug: acetaminophen Take 1,300 mg by mouth every 8 (eight) hours as needed for pain.   vitamin C 1000 MG tablet Take 2,000 mg by mouth at bedtime.   Vitamin D 125 MCG (5000 UT) Caps Take 1 capsule by mouth at bedtime.               Durable Medical Equipment  (From admission, onward)           Start     Ordered   11/03/21 1413  DME Walker rolling  Once       Question:  Patient needs a walker to treat with the following condition  Answer:  Total knee replacement status   11/03/21 1412   11/03/21 1413  DME Bedside commode  Once       Question:  Patient needs a bedside commode to treat with the following condition  Answer:  Total knee replacement status   11/03/21 1412            Diagnostic Studies: DG  Knee Right Port  Result Date: 11/03/2021 CLINICAL DATA:  Postop EXAM: PORTABLE RIGHT KNEE - 2 VIEW COMPARISON:  None Available. FINDINGS: Expected postsurgical changes from a total knee arthroplasty with a surgical drain in place. Normal alignment. No fracture. No significant subcutaneous emphysema. No radiographically quantifiable effusion. Midline skin staples in place. IMPRESSION: Expected postsurgical changes from a total knee arthroplasty. Electronically Signed   By: Marin Roberts M.D.   On: 11/03/2021 14:49    Disposition:  Follow-up Information     Fausto Skillern, PA-C Follow up on 11/17/2021.   Specialty: Orthopedic Surgery Why: at 1:45pm Contact information: [place] [id number] [phone]         Dereck Leep, MD Follow up on 12/14/2021.   Specialty: Orthopedic Surgery Why: at 2:30pm Contact information: [place] [address] [phone]                  Signed: Feliberto Gottron 11/04/2021, 8:20 AM

## 2021-11-04 NOTE — Evaluation (Addendum)
Physical Therapy Evaluation Patient Details Name: Alexandria Carroll MRN: [id number] DOB: 09/03/1945 Today's Date: 11/04/2021  History of Present Illness  Pt is a 76 year old female s/p R TKA 11/03/21  Clinical Impression  Pt presents POD #2 for right TKA A&O x 4 and agreeable to therapy sitting in bed with family presents. She exhibits decreased right knee strength and ROM along with increased right knee pain. She was able to ambulate with consistent right heel contact with BUE support and able to negotiate stairs with BUE support using railings. Pt will benefit from skilled HHPT to address aforementioned deficits so that pt can return to walking without an AD and to walking with increased stability.         Recommendations for follow up therapy are one component of a multi-disciplinary discharge planning process, led by the attending physician.  Recommendations may be updated based on patient status, additional functional criteria and insurance authorization.  Follow Up Recommendations Home health PT      Assistance Recommended at Discharge Intermittent Supervision/Assistance  Patient can return home with the following  A little help with walking and/or transfers;Assist for transportation;Assistance with feeding    Equipment Recommendations    Recommendations for Other Services       Functional Status Assessment Patient has had a recent decline in their functional status and demonstrates the ability to make significant improvements in function in a reasonable and predictable amount of time.     Precautions / Restrictions Precautions Precautions: Fall;Knee Precaution Booklet Issued: Yes (comment) Restrictions Weight Bearing Restrictions: No      Mobility  Bed Mobility Overal bed mobility: Independent                  Transfers Overall transfer level: Modified independent Equipment used: Rolling walker (2 wheels) Transfers: Sit to/from Stand Sit to Stand: Modified  independent (Device/Increase time)                Ambulation/Gait Ambulation/Gait assistance: Modified independent (Device/Increase time) Gait Distance (Feet): 400 Feet Assistive device: Rolling walker (2 wheels) Gait Pattern/deviations: Step-through pattern, Decreased step length - left, Decreased stance time - right, Antalgic Gait velocity: slow     General Gait Details: Antalgic gait right  Stairs Stairs: Yes Stairs assistance: Modified independent (Device/Increase time) Stair Management: Two rails Number of Stairs: 5 General stair comments: Two feet per step with BUE support using railings  Wheelchair Mobility    Modified Rankin (Stroke Patients Only)       Balance Overall balance assessment: Modified Independent Sitting-balance support: No upper extremity supported Sitting balance-Leahy Scale: Normal     Standing balance support: Bilateral upper extremity supported Standing balance-Leahy Scale: Good                               Pertinent Vitals/Pain Pain Assessment Pain Assessment: 0-10 Pain Score: 1  Pain Location: R knee Pain Descriptors / Indicators: Aching, Discomfort Pain Intervention(s): Monitored during session    Home Living Family/patient expects to be discharged to:: Private residence Living Arrangements: Spouse/significant other Available Help at Discharge: Family Type of Home: House Home Access: Stairs to enter Entrance Stairs-Rails: Can reach both Entrance Stairs-Number of Steps: 4   Home Layout: One level Home Equipment: Grab bars - tub/shower;Shower seat Additional Comments: OT ordering BSC/3 in 1    Prior Function Prior Level of Function : Independent/Modified Independent  Hand Dominance        Extremity/Trunk Assessment   Upper Extremity Assessment Upper Extremity Assessment: Defer to OT evaluation    Lower Extremity Assessment Lower Extremity Assessment: RLE  deficits/detail RLE Deficits / Details: s/p R TKA, decreased right knee strength and ROM RLE Sensation: WNL RLE Coordination: WNL       Communication   Communication: No difficulties  Cognition Arousal/Alertness: Awake/alert Behavior During Therapy: WFL for tasks assessed/performed Overall Cognitive Status: Within Functional Limits for tasks assessed                                          General Comments      Exercises Total Joint Exercises Goniometric ROM: R Knee Ext A/PROM 4 deg, R Knee Flex A/PROM 100 deg Other Exercises Other Exercises: Heel Slides 1 x 15 Other Exercises: Straigtht leg raise 1 x 15 Other Exercises: Quad Sets 1 x 15 Other Exercises: Hip Abduction 1 x 15 Other Exercises: Ankle Pumps 1 x 15   Assessment/Plan    PT Assessment Patient needs continued PT services  PT Problem List Decreased strength;Decreased range of motion;Decreased mobility;Obesity;Pain       PT Treatment Interventions Gait training;Stair training;Therapeutic activities;Therapeutic exercise;Neuromuscular re-education;Patient/family education    PT Goals (Current goals can be found in the Care Plan section)  Acute Rehab PT Goals Patient Stated Goal: To return home safely PT Goal Formulation: With patient/family Time For Goal Achievement: 11/18/21 Potential to Achieve Goals: Good    Frequency BID     Co-evaluation               AM-PAC PT "6 Clicks" Mobility  Outcome Measure Help needed turning from your back to your side while in a flat bed without using bedrails?: None Help needed moving from lying on your back to sitting on the side of a flat bed without using bedrails?: A Little Help needed moving to and from a bed to a chair (including a wheelchair)?: A Little Help needed standing up from a chair using your arms (e.g., wheelchair or bedside chair)?: A Little Help needed to walk in hospital room?: A Little Help needed climbing 3-5 steps with a  railing? : A Little 6 Click Score: 19    End of Session Equipment Utilized During Treatment: Gait belt Activity Tolerance: Patient tolerated treatment well Patient left: in bed;with call bell/phone within reach;with bed alarm set;with family/visitor present Nurse Communication: Mobility status PT Visit Diagnosis: Unsteadiness on feet (R26.81);Other abnormalities of gait and mobility (R26.89);Muscle weakness (generalized) (M62.81);Difficulty in walking, not elsewhere classified (R26.2)    Time: 1010-1035 PT Time Calculation (min) (ACUTE ONLY): 25 min   Charges:   PT Evaluation $PT Eval Low Complexity: 1 Low PT Treatments $Therapeutic Activity: 8-22 mins       Bradly Chris PT, DPT  11/04/2021, 12:25 PM

## 2021-11-04 NOTE — Plan of Care (Signed)

## 2021-11-04 NOTE — Evaluation (Signed)
Occupational Therapy Evaluation Patient Details Name: Alexandria Carroll MRN: [id number] DOB: 1945/11/05 Today's Date: 11/04/2021   History of Present Illness Pt is a 76 year old female s/p R TKA 11/03/21   Clinical Impression   Pt seen for OT evaluation this date, POD#1 from above surgery. PTA pt was indep-MOD I in ADL/IADL. Pt currently requires minimal assist for LB dressing while in seated position due to pain and limited AROM of R knee. Pt provided education re:  polar care mgt, falls prevention strategies, home/routines modifications, DME/AE for LB bathing and dressing tasks, and compression stocking mgt via hand out, demo. Pt would benefit from skilled OT services including additional instruction in dressing techniques with or without assistive devices for dressing and bathing skills to support recall and carryover prior to discharge and ultimately to maximize safety, independence, and minimize falls risk and caregiver burden. Do not currently anticipate any OT needs following this hospitalization.   OT will continue to follow acutely.      Recommendations for follow up therapy are one component of a multi-disciplinary discharge planning process, led by the attending physician.  Recommendations may be updated based on patient status, additional functional criteria and insurance authorization.   Follow Up Recommendations  No OT follow up    Assistance Recommended at Discharge Intermittent Supervision/Assistance  Patient can return home with the following A little help with walking and/or transfers;A little help with bathing/dressing/bathroom    Functional Status Assessment  Patient has had a recent decline in their functional status and demonstrates the ability to make significant improvements in function in a reasonable and predictable amount of time.  Equipment Recommendations  BSC/3in1    Recommendations for Other Services       Precautions / Restrictions Precautions Precautions:  Fall;Knee      Mobility Bed Mobility               General bed mobility comments: NT pt on edge of bed prior to/end of sessino    Transfers Overall transfer level: Needs assistance Equipment used: Rolling walker (2 wheels) Transfers: Sit to/from Stand Sit to Stand: Supervision, Min guard           General transfer comment: 2 attempts, intermittent vcs for safe RW use      Balance Overall balance assessment: Needs assistance Sitting-balance support: Feet supported Sitting balance-Leahy Scale: Good     Standing balance support: Bilateral upper extremity supported, Reliant on assistive device for balance Standing balance-Leahy Scale: Good                             ADL either performed or assessed with clinical judgement   ADL Overall ADL's : Needs assistance/impaired Eating/Feeding: Set up;Sitting   Grooming: Wash/dry hands;Standing;Supervision/safety           Upper Body Dressing : Set up;Sitting   Lower Body Dressing: Minimal assistance Lower Body Dressing Details (indicate cue type and reason): socks, pt reports she donned underwear this morning without issue Toilet Transfer: Supervision/safety;Rolling walker (2 wheels);BSC/3in1 Toilet Transfer Details (indicate cue type and reason): one vc for technique         Functional mobility during ADLs: Supervision/safety;Rolling walker (2 wheels) (approx 20' in room with RW, intermittent vcs for RW technique)       Vision Patient Visual Report: No change from baseline       Perception     Praxis      Pertinent Vitals/Pain Pain  Assessment Pain Assessment: 0-10 Pain Score: 2  Pain Location: R knee, headache Pain Descriptors / Indicators: Aching, Discomfort Pain Intervention(s): Limited activity within patient's tolerance, Monitored during session, Repositioned     Hand Dominance     Extremity/Trunk Assessment Upper Extremity Assessment Upper Extremity Assessment: Overall WFL for  tasks assessed   Lower Extremity Assessment Lower Extremity Assessment: RLE deficits/detail RLE Deficits / Details: s/p R TKA       Communication Communication Communication: No difficulties   Cognition Arousal/Alertness: Awake/alert Behavior During Therapy: WFL for tasks assessed/performed Overall Cognitive Status: Within Functional Limits for tasks assessed                                       General Comments       Exercises     Shoulder Instructions      Home Living Family/patient expects to be discharged to:: Private residence Living Arrangements: Spouse/significant other   Type of Home: House Home Access: Stairs to enter Technical brewer of Steps: 4 Entrance Stairs-Rails: Can reach both Home Layout: One level     Bathroom Shower/Tub: Tub/shower unit;Walk-in shower   Bathroom Toilet: Handicapped height Bathroom Accessibility: No   Home Equipment: Grab bars - tub/shower;Shower seat   Additional Comments: pt reports she does not feelcomfortable using seat      Prior Functioning/Environment Prior Level of Function : Independent/Modified Independent                        OT Problem List: Decreased activity tolerance      OT Treatment/Interventions: Self-care/ADL training;Patient/family education;Therapeutic exercise;DME and/or AE instruction;Therapeutic activities    OT Goals(Current goals can be found in the care plan section) Acute Rehab OT Goals Patient Stated Goal: go home OT Goal Formulation: With patient/family Time For Goal Achievement: 11/18/21 Potential to Achieve Goals: Good ADL Goals Pt Will Perform Grooming: with modified independence;standing Pt Will Perform Lower Body Dressing: with modified independence;sit to/from stand Pt Will Transfer to Toilet: with modified independence Pt Will Perform Toileting - Clothing Manipulation and hygiene: with modified independence  OT Frequency: Min 2X/week     Co-evaluation              AM-PAC OT "6 Clicks" Daily Activity     Outcome Measure Help from another person eating meals?: None Help from another person taking care of personal grooming?: None Help from another person toileting, which includes using toliet, bedpan, or urinal?: None Help from another person bathing (including washing, rinsing, drying)?: None Help from another person to put on and taking off regular upper body clothing?: None Help from another person to put on and taking off regular lower body clothing?: A Little 6 Click Score: 23   End of Session Equipment Utilized During Treatment: Rolling walker (2 wheels) Nurse Communication: Mobility status  Activity Tolerance: Patient tolerated treatment well Patient left: Other (comment) (at edge of bed with daugther present)  OT Visit Diagnosis: Unsteadiness on feet (R26.81)                Time: 1517-6160 OT Time Calculation (min): 21 min Charges:  OT General Charges $OT Visit: 1 Visit  Shanon Payor, OTD OTR/L  11/04/21, 10:38 AM

## 2021-11-04 NOTE — Progress Notes (Signed)
   Subjective: 1 Day Post-Op Procedure(s) (LRB): COMPUTER ASSISTED TOTAL KNEE ARTHROPLASTY - RNFA (Right) Patient reports pain as mild.   Patient is well, and has had no acute complaints or problems Denies any CP, SOB, ABD pain. We will continue therapy today.  Plan is to go Home after hospital stay.  Objective: Vital signs in last 24 hours: Temp:  [97 F (36.1 C)-98.2 F (36.8 C)] 97.7 F (36.5 C) (10/21 0724) Pulse Rate:  [65-82] 67 (10/21 0724) Resp:  [10-18] 17 (10/21 0724) BP: (94-141)/(54-80) 129/64 (10/21 0724) SpO2:  [94 %-100 %] 98 % (10/21 0724) Weight:  [88.9 kg] 88.9 kg (10/20 1026)  Intake/Output from previous day: 10/20 0701 - 10/21 0700 In: 2180.1 [P.O.:600; I.V.:1001.6; IV Piggyback:578.5] Out: 1120 [Urine:900; Drains:170; Blood:50] Intake/Output this shift: No intake/output data recorded.  No results for input(s): "HGB" in the last 72 hours. No results for input(s): "WBC", "RBC", "HCT", "PLT" in the last 72 hours. No results for input(s): "NA", "K", "CL", "CO2", "BUN", "CREATININE", "GLUCOSE", "CALCIUM" in the last 72 hours. No results for input(s): "LABPT", "INR" in the last 72 hours.  EXAM General - Patient is Alert, Appropriate, and Oriented Extremity - Neurovascular intact Sensation intact distally Intact pulses distally Dorsiflexion/Plantar flexion intact Dressing - dressing C/D/I and no drainage Hemovac removed Motor Function - intact, moving foot and toes well on exam.   Past Medical History:  Diagnosis Date   COVID    mild   GERD (gastroesophageal reflux disease)    Hypertension    Multilevel degenerative disc disease    cervical, lumbar   PONV (postoperative nausea and vomiting)    Vertigo 10/2019   [place] manuever helped   Wears contact lenses     Assessment/Plan:   1 Day Post-Op Procedure(s) (LRB): COMPUTER ASSISTED TOTAL KNEE ARTHROPLASTY - RNFA (Right) Principal Problem:   Total knee replacement status  Estimated body mass  index is 31.64 kg/m as calculated from the following:   Height as of this encounter: '5\' 6"'$  (1.676 m).   Weight as of this encounter: 88.9 kg. Advance diet Up with therapy Vital signs are stable Pain well controlled Care management to assist with discharge to home with home health PT today pending completion of PT goals  DVT Prophylaxis - Lovenox, TED hose, and SCDs Weight-Bearing as tolerated to right leg   T. Rachelle Hora, PA-C [place] 11/04/2021, 8:16 AM

## 2021-11-06 ENCOUNTER — Encounter: Payer: Self-pay | Admitting: Orthopedic Surgery

## 2022-01-18 ENCOUNTER — Other Ambulatory Visit: Payer: Self-pay | Admitting: Internal Medicine

## 2022-01-18 DIAGNOSIS — Z1231 Encounter for screening mammogram for malignant neoplasm of breast: Secondary | ICD-10-CM

## 2022-02-08 ENCOUNTER — Ambulatory Visit
Admission: RE | Admit: 2022-02-08 | Discharge: 2022-02-08 | Disposition: A | Discharge status: Home / Self Care | Payer: Medicare HMO | Source: Ambulatory Visit | Attending: Internal Medicine | Admitting: Internal Medicine

## 2022-02-08 DIAGNOSIS — Z1231 Encounter for screening mammogram for malignant neoplasm of breast: Secondary | ICD-10-CM | POA: Diagnosis not present

## 2022-04-24 ENCOUNTER — Ambulatory Visit: Payer: Medicare HMO

## 2022-04-24 DIAGNOSIS — K640 First degree hemorrhoids: Secondary | ICD-10-CM

## 2022-04-24 DIAGNOSIS — K5730 Diverticulosis of large intestine without perforation or abscess without bleeding: Secondary | ICD-10-CM

## 2022-04-24 DIAGNOSIS — D122 Benign neoplasm of ascending colon: Secondary | ICD-10-CM

## 2022-08-11 IMAGING — MR MR ANKLE*L* W/O CM
5 series · 40 of 40 positions shown · non-contrast
Comparison: None.

CLINICAL DATA: Ankle pain

EXAM:
MRI OF THE LEFT ANKLE WITHOUT CONTRAST
TECHNIQUE: Multiplanar, multisequence MR imaging of the ankle was performed. No
intravenous contrast was administered.

[Series 3: PD fat-sat · axial · 3.0mm · 0.70mm/px · z∈[-135,-6]mm · 9 of 40 slices shown]
[im 1/40]
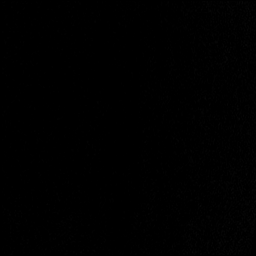
[im 5/40]
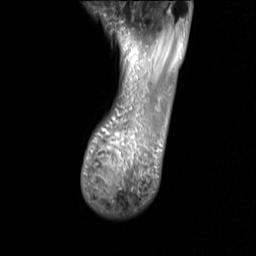
[im 10/40]
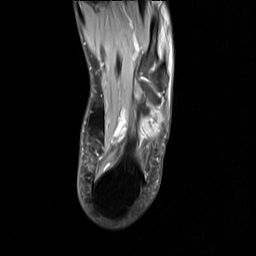
[im 15/40]
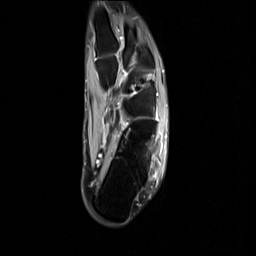
[im 20/40]
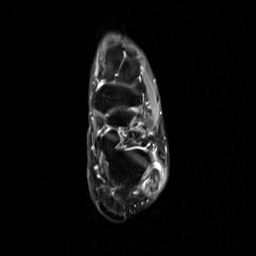
[im 25/40]
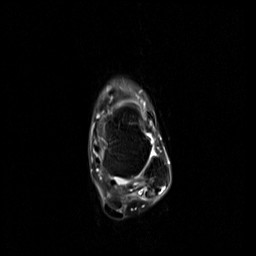
[im 30/40]
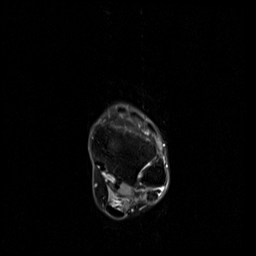
[im 35/40]
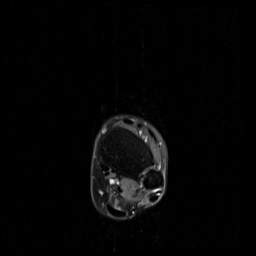
[im 40/40]
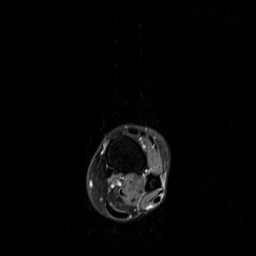

[Series 4: T2 fat-sat · axial · 3.0mm · 0.70mm/px · z∈[-135,-6]mm · 9 of 40 slices shown (1 of 2)]
[im 1/40]
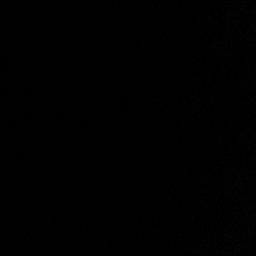
[im 5/40]
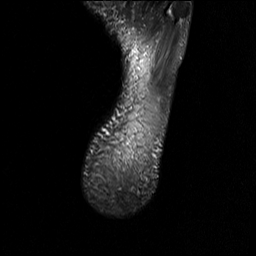
[im 10/40]
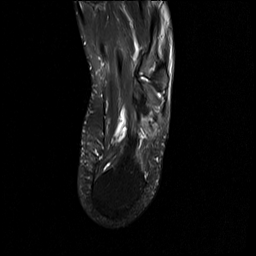
[im 15/40]
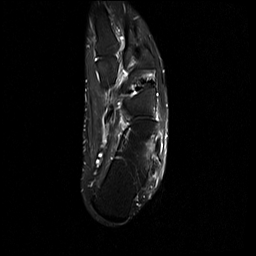
[im 20/40]
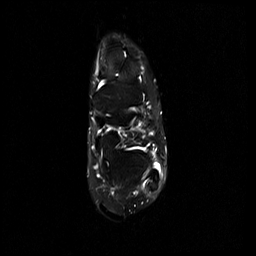
[im 25/40]
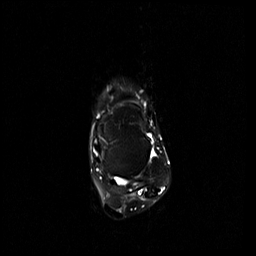
[im 30/40]
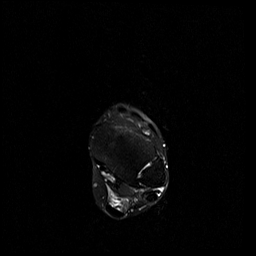
[im 35/40]
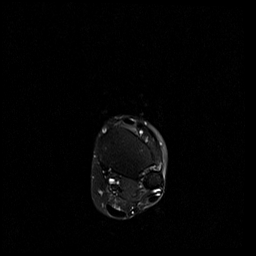
[im 40/40]
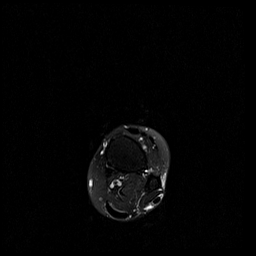

[Series 5: T2 fat-sat · coronal · 3.0mm · 0.70mm/px · 10 of 45 slices shown (2 of 2)]
[im 1/45]
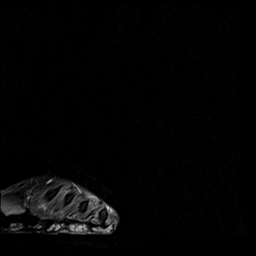
[im 5/45]
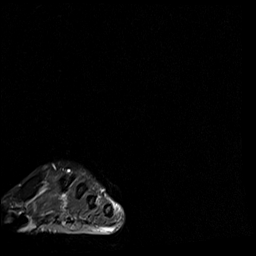
[im 10/45]
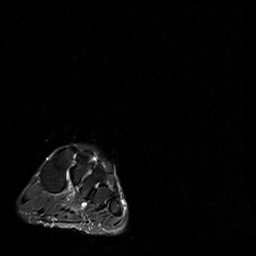
[im 15/45]
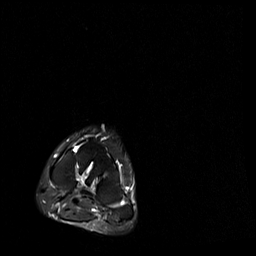
[im 20/45]
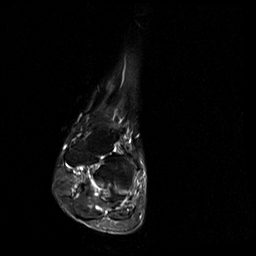
[im 25/45]
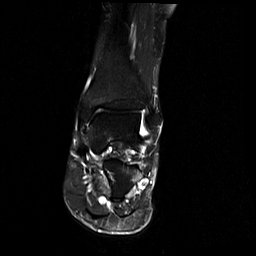
[im 30/45]
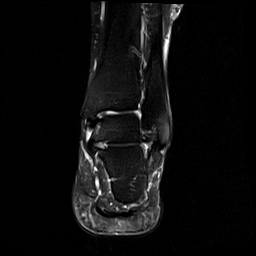
[im 35/45]
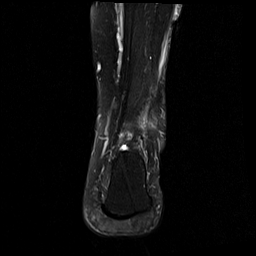
[im 40/45]
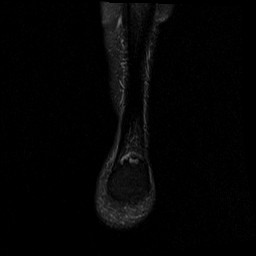
[im 45/45]
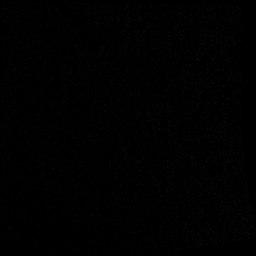

[Series 6: T1 · sagittal · 3.0mm · 0.56mm/px · 6 of 26 slices shown]
[im 1/26]
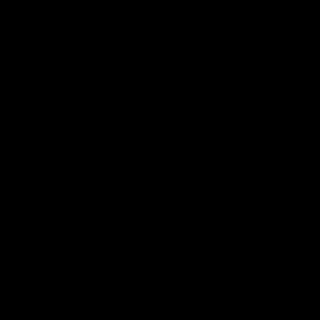
[im 6/26]
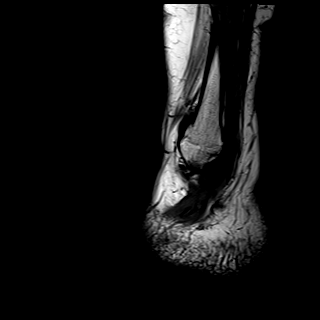
[im 11/26]
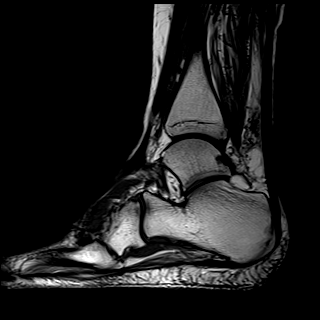
[im 16/26]
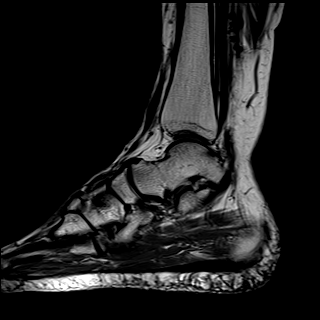
[im 21/26]
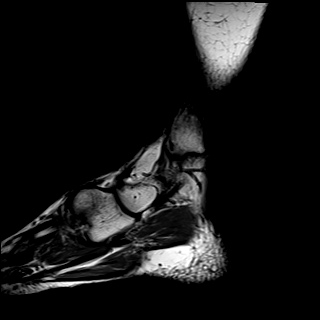
[im 26/26]
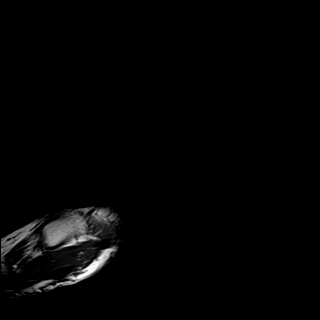

[Series 7: STIR · sagittal · 3.0mm · 0.70mm/px · 6 of 26 slices shown]
[im 1/26]
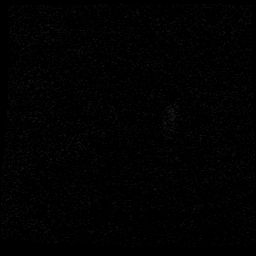
[im 6/26]
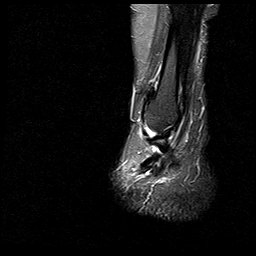
[im 11/26]
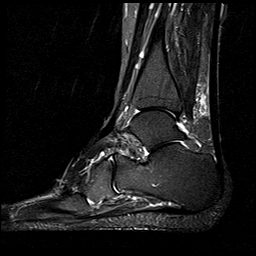
[im 16/26]
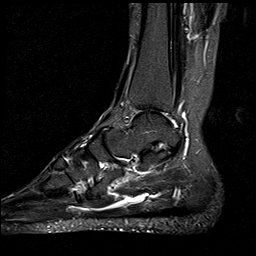
[im 21/26]
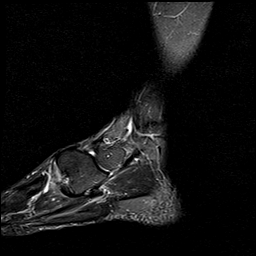
[im 26/26]
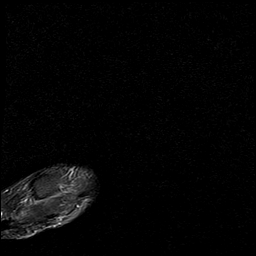

[40 of 40 positions shown; findings below may reference images not displayed]

FINDINGS: TENDONS

Peroneal: There is a focal tear of the peroneal longus tendon at the
level of the hindfoot with a gap of approximately 1.2 cm with fluid
surrounding this area. There is retraction of the proximal portion
of the peroneal tendon. Fluid is seen surrounding the peroneal
brevis tendon.

Posteromedial: Posterior tibial tendon intact. Flexor hallucis
longus tendon intact. Flexor digitorum longus tendon intact.

Anterior: Tibialis anterior tendon intact. Extensor hallucis longus
tendon intact Extensor digitorum longus tendon intact.

Achilles:  Intact.

Plantar Fascia: Mildly increased signal and thickening seen within
the middle cord of the plantar fascia.

LIGAMENTS

Lateral: Anterior talofibular ligament intact. Calcaneofibular
ligament intact. Posterior talofibular ligament intact. Anterior and
posterior tibiofibular ligaments intact.

Medial: Deltoid ligament intact. Spring ligament intact.

CARTILAGE

Ankle Joint: A small ankle joint effusion is seen. Normal ankle
mortise. No chondral defect.

Subtalar Joints/Sinus Tarsi: Normal subtalar joints. No subtalar
joint effusion. Normal sinus tarsi.

Bones: No marrow signal abnormality. No fracture, marrow edema, or
pathologic marrow infiltration.

Soft Tissue:
IMPRESSION: Focal perforation of the peroneal longus tendon at the level of the
hindfoot with a gap of approximately 1.2 cm in surrounding fluid.

Lateral to peroneal brevis tenosynovitis.

## 2023-03-19 ENCOUNTER — Other Ambulatory Visit: Payer: Self-pay | Admitting: Internal Medicine

## 2023-03-19 DIAGNOSIS — Z1231 Encounter for screening mammogram for malignant neoplasm of breast: Secondary | ICD-10-CM

## 2023-04-15 ENCOUNTER — Ambulatory Visit
Admission: RE | Admit: 2023-04-15 | Discharge: 2023-04-15 | Disposition: A | Discharge status: Home / Self Care | Source: Ambulatory Visit | Attending: Internal Medicine | Admitting: Internal Medicine

## 2023-04-15 DIAGNOSIS — Z1231 Encounter for screening mammogram for malignant neoplasm of breast: Secondary | ICD-10-CM | POA: Diagnosis present

## 2023-12-18 ENCOUNTER — Ambulatory Visit: Attending: Family Medicine

## 2023-12-18 DIAGNOSIS — R262 Difficulty in walking, not elsewhere classified: Secondary | ICD-10-CM | POA: Insufficient documentation

## 2023-12-18 DIAGNOSIS — R42 Dizziness and giddiness: Secondary | ICD-10-CM | POA: Insufficient documentation

## 2023-12-18 NOTE — Therapy (Signed)
 OUTPATIENT PHYSICAL THERAPY EVALUATION   Patient Name: Alexandria Carroll MRN: [id number] DOB:10-16-1945, 78 y.o., female Today's Date: 12/18/2023  PCP: Cleotilde Oneil FALCON, MD REFERRING PROVIDER: Cyrus Selinda Moose,*  END OF SESSION:  PT End of Session - 12/18/23 1019     Visit Number 1    Number of Visits 16    Date for Recertification  02/12/24    Authorization Type AETNA Medicare    Authorization Time Period AETNA Medicare    Progress Note Due on Visit 10    PT Start Time 1015    PT Stop Time 1055    PT Time Calculation (min) 40 min    Activity Tolerance Patient tolerated treatment well;No increased pain    Behavior During Therapy WFL for tasks assessed/performed          Past Medical History:  Diagnosis Date   COVID    mild   GERD (gastroesophageal reflux disease)    Hypertension    Multilevel degenerative disc disease    cervical, lumbar   PONV (postoperative nausea and vomiting)    Vertigo 10/2019   Eppley manuever helped   Wears contact lenses    Past Surgical History:  Procedure Laterality Date   COLONOSCOPY     DILATION AND CURETTAGE OF UTERUS     pt states  not having this procedure in the past   KNEE ARTHROPLASTY Right 11/03/2021   Procedure: COMPUTER ASSISTED TOTAL KNEE ARTHROPLASTY - RNFA;  Surgeon: Mardee Lynwood SQUIBB, MD;  Location: [facility];  Service: Orthopedics;  Laterality: Right;   NOSE SURGERY     TENDON REPAIR Left 02/04/2020   Procedure: FLEXOR TENDON REPAIR - SECOND LEFT;  Surgeon: Lennie Barter, DPM;  Location: [facility];  Service: Podiatry;  Laterality: Left;   Patient Active Problem List   Diagnosis Date Noted   Total knee replacement status 11/03/2021   Primary osteoarthritis of right knee 10/01/2021   Tubular adenoma 10/01/2017   Cervical disc disease 06/24/2013   Lumbar disc disease 06/24/2013   HTN (hypertension) 06/24/2013    ONSET DATE: 2am 12/16/23  REFERRING DIAG: Vertigo, dizziness  THERAPY DIAG:  Dizziness and  giddiness  Difficulty in walking, not elsewhere classified  Rationale for Evaluation and Treatment: Rehabilitation  SUBJECTIVE:                                                                                                                                                                                             SUBJECTIVE STATEMENT: Pt has felt awful since onset, nauseated a lot and unable to turn her head much.   PERTINENT HISTORY:  78yoF  seen by PCP on 12/16/23 for dizziness and vertigo, started 2am Saturday when she started 'spinning like crazy' while in bed. Pt reports nausea and retching. Similar episode 5 years prior that resolved with canalith repositioning with ENT, no episodes since. Symptoms were persistent without remittence. MD noted tinnitus with cerumen impaction which was removed Rt ear. PAIN:  Are you having pain? No  PRECAUTIONS: Fall  WEIGHT BEARING RESTRICTIONS: N/A  FALLS: Has patient fallen in last 6 months? No  PLOF: Ad lib community AMB without assistance or device; no recent falls or close calls.   PATIENT GOALS: be rid of vertigo   OBJECTIVE:  Note: Objective measures were completed at Evaluation unless otherwise noted.  DIAGNOSTIC FINDINGS:  -no diagnostic imaging or labs performed    CERVICAL ROM SCREENING:  -adequate ROM for vestibular screening and canalith repositioning maneuvers   GAIT: Findings: generally guarded, no frank LOB, mild deviation in LOP over distances >24ft   PATIENT SURVEYS:  DHI: deferred  ABCS: deferred   ORTHOSTATIC VITAL SIGNS:  150/44mmHg 70bpm seated 148/74mmHg 72bpm standing (no increased symptoms)  Oculomotor Screening:   Gaze fixation WNL, no spontaneous nystagmus  horizontal pursuits Moderate saccadic intrusions both directions  Rt/Lt  gaze holding WNL, no spontaneous nystagmus  vertical pursuits WNL  up/down gaze holding WNL  horizontal saccades  WNL  vertical saccades  Not tested   vergence  Not tested    Horizontal VOR Not tolerated   Vertical VOR  Not tolerated   Vision suppressed:   neutral gaze Spontaneous alternating arrhythmic nystagmus  Rt/Lt  gaze holding WNL no nystagmus  up/down gaze holding Not tested  Horizontal saccades  WNL      Cross cover test/Test of Skew:  -negative, WNL  Head Impulse Test/VOR: -Right: negative, WNL -Left: negative, WNL  CANAL TESTING VISION SUPPRESSED: Dix Hallpike:   -Right: negative ((-) symptoms, (+) left lateral beating nystagmus with ~10 sec latency, low amplitude)   -Left: negative (+) symptoms, (+) left lateral beating nystagmus with immediate onset, moderate amplitude with progressive left lateral drift.  Roll Test:   -Right: positive; immediate onset, severe vertigo which precludes full 90 degrees, symptoms largely resolves in <45sec. (+) lateral beating geotropic nystagmus.   -Left: positive, immediate onset, severe vertigo, symptoms largely resolves in <45sec. (+) lateral beating geotropic nystagmus.   CANALITH REPOSITIONING: *SEE TREATMENT SECTION                                                                                                                             TREATMENT DATE 12/18/23:   -Barbecue roll maneuver for Rt sided horizontal canal BPPV; severe symptoms with each resting phase, supervision assistance required for tactile cues of head position, verbal instructions, and safe performance to preclude rolling off of plinth.  -after completion, pt nauseated and retching into emesis bag x2 minutes while seated upright.  -symptoms eventually improve to allow for safe exit to waiting area.   PATIENT EDUCATION:  Education details: Consider bringing meclizine or taking prior to treatment for next session; not uncommon for brief increase in symptoms following canalith repositioning, monitor symptoms over next 24 hours and take measures to prevent falling.  Person educated: Museum/gallery Exhibitions Officer: collaborative learning,  deliberate practice, positive reinforcement, explicit instruction, establish rules. Education comprehension: verbalized understanding  HOME EXERCISE PROGRAM: None  GOALS: Goals reviewed with patient? No  SHORT TERM GOALS: Target date: 02/12/24   Improved DHI by >25% to indicated reduced disability.  Baseline: Goal status: INITIAL   2.  Pt to demonstrate full return of tolerance to automobile transportation and/or driving without any symptoms or intolerance to rapid head movements.  Baseline:  Goal status: INITIAL   3.  Pt to report ability to perform all bed mobility without any triggered vertigo.  Baseline: all head movements cause vertigo  Goal status: INITIAL  ASSESSMENT:  CLINICAL IMPRESSION: 78oF referred to OPPT vestibular rehab from PCP for acute onset vertigo. Exam reassuring, inconsistent with central etiology, consistent with horizontal canal BPPV, however irritability and severity of vertigo make it difficult to determine which side is involved. Pt reports Rt side typically more tolerable but worse today in examination, hence decided to begin treatment with Rt side with barbecue roll maneuver. Pt has greatest levels of vertigo and nausea following this procedure, however not uncommon for treatment of horizontal canals. Pt will FU in 2 days and based on feed back at that time will likely opt to treat both sides with Gufoni maneuver as patient is hoping to keep her travel plans in 5 days. Patient will benefit from skilled physical therapy intervention to reduce deficits and impairments identified in evaluation, in order to reduce pain, improve quality of life, and maximize activity tolerance for ADL, IADL, and leisure/fitness. Physical therapy will help pt achieve long and short term goals of care.   OBJECTIVE IMPAIRMENTS: Decreased knowledge of condition, decreased use of DME, decreased mobility, difficulty walking, decreased strength, decreased ROM. ACTIVITY LIMITATIONS:  Lifting, standing, walking, squatting, transfers, locomotion level PARTICIPATION LIMITATIONS: Cleaning, laundry, interpersonal relationships, driving, yardwork, community activity.  PERSONAL FACTORS: Age, behavior pattern, education, past/current experiences, transportation, profession  are also affecting patient's functional outcome.  REHAB POTENTIAL: Good CLINICAL DECISION MAKING: Medium  EVALUATION COMPLEXITY: Moderate    PLAN:  PT FREQUENCY: 1-2/week  PT DURATION: 8 weeks  PLANNED INTERVENTIONS: 97110-Therapeutic exercises, 97530- Therapeutic activity, 97112- Neuromuscular re-education, 97535- Self Care, 02859- Manual therapy, [phone]- Gait training, [phone]- Orthotic Initial, [phone]- Canalith repositioning, Patient/Family education, Balance training, Stair training, Joint mobilization, Joint manipulation, Spinal manipulation, Spinal mobilization, Vestibular training, Visual/preceptual remediation/compensation, Cryotherapy, and Moist heat  PLAN FOR NEXT SESSION:  -repeat canalith repositioning as needed, bilat gufoni for geotropic   4:37 PM, 12/20/23 Peggye JAYSON Linear, PT, DPT Physical Therapist - [facility]  Outpatient Physical Therapy- Main Campus [phone]      Arlander Gillen C, PT 12/18/2023, 10:25 AM

## 2023-12-20 ENCOUNTER — Ambulatory Visit: Admitting: Physical Therapy

## 2023-12-20 DIAGNOSIS — R42 Dizziness and giddiness: Secondary | ICD-10-CM

## 2023-12-20 DIAGNOSIS — R262 Difficulty in walking, not elsewhere classified: Secondary | ICD-10-CM

## 2023-12-20 NOTE — Therapy (Signed)
 OUTPATIENT PHYSICAL THERAPY TREATMENT   Patient Name: Alexandria Carroll MRN: [id number] DOB:[birth year], 78 y.o., female Today's Date: 12/20/2023  PCP: Cleotilde Oneil FALCON, MD REFERRING PROVIDER: Cyrus Selinda Moose,*  END OF SESSION:  PT End of Session - 12/20/23 0858     Visit Number 2    Number of Visits 16    Date for Recertification  02/12/24    Authorization Type AETNA Medicare    Progress Note Due on Visit 10    PT Start Time 0845    PT Stop Time 0925    PT Time Calculation (min) 40 min    Activity Tolerance Patient tolerated treatment well;No increased pain    Behavior During Therapy WFL for tasks assessed/performed          Past Medical History:  Diagnosis Date   COVID    mild   GERD (gastroesophageal reflux disease)    Hypertension    Multilevel degenerative disc disease    cervical, lumbar   PONV (postoperative nausea and vomiting)    Vertigo 10/2019   Eppley manuever helped   Wears contact lenses    Past Surgical History:  Procedure Laterality Date   COLONOSCOPY     DILATION AND CURETTAGE OF UTERUS     pt states  not having this procedure in the past   KNEE ARTHROPLASTY Right 11/03/2021   Procedure: COMPUTER ASSISTED TOTAL KNEE ARTHROPLASTY - RNFA;  Surgeon: Mardee Lynwood SQUIBB, MD;  Location: [facility];  Service: Orthopedics;  Laterality: Right;   NOSE SURGERY     TENDON REPAIR Left 02/04/2020   Procedure: FLEXOR TENDON REPAIR - SECOND LEFT;  Surgeon: Lennie Barter, DPM;  Location: [facility];  Service: Podiatry;  Laterality: Left;   Patient Active Problem List   Diagnosis Date Noted   Total knee replacement status 11/03/2021   Primary osteoarthritis of right knee 10/01/2021   Tubular adenoma 10/01/2017   Cervical disc disease 06/24/2013   Lumbar disc disease 06/24/2013   HTN (hypertension) 06/24/2013    ONSET DATE: 2am 12/16/23  REFERRING DIAG: Vertigo, dizziness  THERAPY DIAG:  Dizziness and giddiness  Difficulty in walking, not  elsewhere classified  Rationale for Evaluation and Treatment: Rehabilitation  SUBJECTIVE:                                                                                                                                                                                             SUBJECTIVE STATEMENT: Pt reports significant symptoms flare after last session, even after resolution of flare up, denies any specific improvement in overall symptoms. Pt took meclizine prior to visit today in anticipation  of reactive symptoms again.   PERTINENT HISTORY:  78yoF seen by PCP on 12/16/23 for dizziness and vertigo, started 2am Saturday when she started 'spinning like crazy' while in bed. Pt reports nausea and retching. Similar episode 5 years prior that resolved with canalith repositioning. Symptoms were persistent without remittence. MD noted tinnitus with cerumen impaction which was removed Rt ear. PAIN:  Are you having pain? No  PRECAUTIONS: Fall  WEIGHT BEARING RESTRICTIONS: N/A  FALLS: Has patient fallen in last 6 months? No  PATIENT GOALS: stop feeling sick all the time.   OBJECTIVE:  Note: Objective measures were completed at Evaluation unless otherwise noted.  DIAGNOSTIC FINDINGS:  -no diagnostic imaging or labs performed    CERVICAL ROM SCREENING:  -rotation >75 degrees bilat seated, without any end range pain.                                                                                                                               TREATMENT DATE 12/20/23:   -education on interpretation of response to Rt sided horizontal canal last session.  -education on how to perform Gufoni for Left sided horizontal canal for geotropic nystagmus -Gufoni Maneuver for Left horizontal canal  -seated cervical rotation assessment: WNL, >85% symptoms reduction -overground AMB 20 meters, WNL, no deviation from LOP, able to turn head, >85% symptoms reduction  PATIENT EDUCATION: Education details: what  to expect following canalith repositioning over the next 1-2 days, indication that she should return for repeat assessment next week or whether she need to return at all.  Person educated: Surveyor, Minerals: discussion Education comprehension: verbalized understanding  HOME EXERCISE PROGRAM: None  GOALS: Goals reviewed with patient? No  SHORT TERM GOALS: Target date: 02/12/24  Improved DHI by >25% to indicated reduced disability.  Baseline: Goal status: INITIAL  2.  Pt to demonstrate full return of tolerance to automobile transportation and/or driving without any symptoms or intolerance to rapid head movements.  Baseline:  Goal status: INITIAL  3.  Pt to report ability to perform all bed mobility without any triggered vertigo.  Baseline: all head movements cause vertigo  Goal status: INITIAL  ASSESSMENT:  CLINICAL IMPRESSION: Patient returns to clinic 2 days after canalith repositioning for Rt sided horizontal canal canalithiasis. Treatment last session seems not to have had any favorable affect for patient, not surprising given difficulty determining which was the positive ear at evaluation. Opted to begin with treatment of Left horizontal canal BPPV this session, with near immediate reduction in symptoms >85%. Pt able to perform ad lib head rotation without vertigo, AMB in a straight line of progression at greater gait speed than upon arrival. Pt educated on what to expect over the next 48 hours, self monitoring over the next week and how to decide whether she needed to return for repeat treatment. We reviewed her IR video and neurophysiology of the BPPV issue she was experiencing. Patient will benefit from skilled physical therapy  intervention to reduce deficits and impairments identified in evaluation, in order to reduce pain, improve quality of life, and maximize activity tolerance for ADL, IADL, and leisure/fitness. Physical therapy will help pt achieve long and short term  goals of care.   OBJECTIVE IMPAIRMENTS: Decreased knowledge of condition, decreased use of DME, decreased mobility, difficulty walking, decreased strength, decreased ROM. ACTIVITY LIMITATIONS: Lifting, standing, walking, squatting, transfers, locomotion level PARTICIPATION LIMITATIONS: Cleaning, laundry, interpersonal relationships, driving, yardwork, community activity.  PERSONAL FACTORS: Age, behavior pattern, education, past/current experiences, transportation, profession  are also affecting patient's functional outcome.  REHAB POTENTIAL: Good CLINICAL DECISION MAKING: Medium  EVALUATION COMPLEXITY: Moderate    PLAN:  PT FREQUENCY: 1-2x/week  PT DURATION: 8 weeks  PLANNED INTERVENTIONS: 97110-Therapeutic exercises, 97530- Therapeutic activity, 97112- Neuromuscular re-education, 97535- Self Care, 02859- Manual therapy, [phone]- Gait training, [phone]- Canalith repositioning, Patient/Family education, Balance training, Stair training, Joint mobilization, Joint manipulation, Spinal manipulation, and Spinal mobilization  PLAN FOR NEXT SESSION: Retest horizontal canals as needed, treat as indicated.   9:37 AM, 12/20/23 Peggye JAYSON Linear, PT, DPT Physical Therapist - [facility]  Outpatient Physical Therapy- Main Campus [phone]     Kadyn Guild C, PT 12/20/2023, 9:00 AM

## 2023-12-31 ENCOUNTER — Ambulatory Visit

## 2023-12-31 DIAGNOSIS — R42 Dizziness and giddiness: Secondary | ICD-10-CM | POA: Diagnosis not present

## 2023-12-31 DIAGNOSIS — R262 Difficulty in walking, not elsewhere classified: Secondary | ICD-10-CM

## 2023-12-31 NOTE — Therapy (Signed)
 OUTPATIENT PHYSICAL THERAPY TREATMENT   Patient Name: Alexandria Carroll MRN: [id number] DOB:[birth year], 78 y.o., female Today's Date: 12/31/2023  PCP: Cleotilde Oneil FALCON, MD REFERRING PROVIDER: Cleotilde Oneil FALCON, MD  END OF SESSION:  PT End of Session - 12/31/23 0852     Visit Number 3    Number of Visits 16    Date for Recertification  02/12/24    Authorization Type AETNA Medicare    Authorization Time Period AET[employer] Medicare    Progress Note Due on Visit 10    PT Start Time 0845    PT Stop Time 0925    PT Time Calculation (min) 40 min    Activity Tolerance Patient tolerated treatment well;No increased pain    Behavior During Therapy WFL for tasks assessed/performed          Past Medical History:  Diagnosis Date   COVID    mild   GERD (gastroesophageal reflux disease)    Hypertension    Multilevel degenerative disc disease    cervical, lumbar   PONV (postoperative nausea and vomiting)    Vertigo 10/2019   Eppley manuever helped   Wears contact lenses    Past Surgical History:  Procedure Laterality Date   COLONOSCOPY     DILATION AND CURETTAGE OF UTERUS     pt states  not having this procedure in the past   KNEE ARTHROPLASTY Right 11/03/2021   Procedure: COMPUTER ASSISTED TOTAL KNEE ARTHROPLASTY - RNFA;  Surgeon: Mardee Lynwood SQUIBB, MD;  Location: [facility];  Service: Orthopedics;  Laterality: Right;   NOSE SURGERY     TENDON REPAIR Left 02/04/2020   Procedure: FLEXOR TENDON REPAIR - SECOND LEFT;  Surgeon: Lennie Barter, DPM;  Location: [facility];  Service: Podiatry;  Laterality: Left;   Patient Active Problem List   Diagnosis Date Noted   Total knee replacement status 11/03/2021   Primary osteoarthritis of right knee 10/01/2021   Tubular adenoma 10/01/2017   Cervical disc disease 06/24/2013   Lumbar disc disease 06/24/2013   HTN (hypertension) 06/24/2013    ONSET DATE: 2am 12/16/23  REFERRING DIAG: Vertigo, dizziness  THERAPY DIAG:  Dizziness and  giddiness  Difficulty in walking, not elsewhere classified  Rationale for Evaluation and Treatment: Rehabilitation  SUBJECTIVE:                                                                                                                                                                                             SUBJECTIVE STATEMENT: Pt reports 90% improvement after Lef tear gufoni last session, however she did not feel great after 5 hour drive to  [facility] Intermittent queasiness that weak. 2 significant episodes of vertigo middle of night while sleeping, both resolved <1 minute. Pt was at PCP for annual visit, he prescribed some empirical prednisone which he read can be helpful in cases of vertigo.   PERTINENT HISTORY:  78yoF seen by PCP on 12/16/23 for dizziness and vertigo, started 2am Saturday when she started 'spinning like crazy' while in bed. Pt reports nausea and retching. Similar episode 5 years prior that resolved with canalith repositioning. Symptoms were persistent without remittence. MD noted tinnitus with cerumen impaction which was removed Rt ear. PAIN:  Are you having pain? No  PRECAUTIONS: Fall  WEIGHT BEARING RESTRICTIONS: N/A  FALLS: Has patient fallen in last 6 months? No  PATIENT GOALS: stop feeling sick all the time.   OBJECTIVE:  Note: Objective measures were completed at Evaluation unless otherwise noted.  DIAGNOSTIC FINDINGS:  -no diagnostic imaging or labs performed    CERVICAL ROM SCREENING:  -rotation >75 degrees bilat seated, without any end range pain.   HORIZONTAL ROLL TEST 12/31/23; -Right side: slight positive: Geotropic nystagmus ~30sec after 20sec latency, then conversion to very mild apogeotropic nystagmus -Left side: moderately positive geotropic nystagmus, converting to apogeotropic nystagmus.                                                                                                                              TREATMENT DATE  12/31/2023:   -education on interpretation of response to Rt sided horizontal canal last session.  -education on how to perform Gufoni for Left sided horizontal canal for geotropic nystagmus -Modified Gufoni Maneuver for Left horizontal canal (much better tolerance that prior session, only has significant vertigo with 1st moverment; -Modified Gufoni Maneuver for Right horizontal canal; no symptoms during treatment, but has dizequilibrium and swimmyheadedness thereafter  -175ft overground AMB with superviison level assistance: has increased LOP deviation/sway after treatment, but no dizziness, no nausea  PATIENT EDUCATION: Education details: what to expect following canalith repositioning over the next 1-2 days, indication that she should return for repeat assessment next week or whether she need to return at all.  Person educated: Surveyor, Minerals: discussion Education comprehension: verbalized understanding  HOME EXERCISE PROGRAM: None  GOALS: Goals reviewed with patient? No  SHORT TERM GOALS: Target date: 02/12/24  Improved DHI by >25% to indicated reduced disability.  Baseline: Goal status: INITIAL  2.  Pt to demonstrate full return of tolerance to automobile transportation and/or driving without any symptoms or intolerance to rapid head movements.  Baseline:  Goal status: INITIAL  3.  Pt to report ability to perform all bed mobility without any triggered vertigo.  Baseline: all head movements cause vertigo  Goal status: INITIAL  ASSESSMENT:  CLINICAL IMPRESSION: Patient returns to clinic 2 weeks post treatment of Left horizontal canal BPPV. Initially pt experienced >90% resolution of symptoms with mild persistent dizziness while on vacation. Pt empirically put on prednisone 7 days prior. Pt with short  duration BPPV over night 1-2x in past week, hence retested horizontal canals slight positive Rt, strong positive Left- both of which converting to opposite direction after  resolution of initial nystagmus phase. Pt slightly swimmy at end of session, but no gagging or emesis. Patient will benefit from skilled physical therapy intervention to reduce deficits and impairments identified in evaluation, in order to reduce pain, improve quality of life, and maximize activity tolerance for ADL, IADL, and leisure/fitness. Physical therapy will help pt achieve long and short term goals of care.   OBJECTIVE IMPAIRMENTS: Decreased knowledge of condition, decreased use of DME, decreased mobility, difficulty walking, decreased strength, decreased ROM. ACTIVITY LIMITATIONS: Lifting, standing, walking, squatting, transfers, locomotion level PARTICIPATION LIMITATIONS: Cleaning, laundry, interpersonal relationships, driving, yardwork, community activity.  PERSONAL FACTORS: Age, behavior pattern, education, past/current experiences, transportation, profession  are also affecting patient's functional outcome.  REHAB POTENTIAL: Good CLINICAL DECISION MAKING: Medium  EVALUATION COMPLEXITY: Moderate    PLAN:  PT FREQUENCY: 1-2x/week  PT DURATION: 8 weeks  PLANNED INTERVENTIONS: 97110-Therapeutic exercises, 97530- Therapeutic activity, 97112- Neuromuscular re-education, 97535- Self Care, 02859- Manual therapy, [phone]- Gait training, [phone]- Canalith repositioning, Patient/Family education, Balance training, Stair training, Joint mobilization, Joint manipulation, Spinal manipulation, and Spinal mobilization  PLAN FOR NEXT SESSION: Retest horizontal canals as needed, treat as indicated.   9:17 AM, 12/31/2023 Peggye JAYSON Linear, PT, DPT Physical Therapist - [facility]  Outpatient Physical Therapy- Main Campus [phone]     [place] C, PT 12/31/2023, 9:17 AM

## 2024-01-06 ENCOUNTER — Ambulatory Visit

## 2024-01-22 ENCOUNTER — Ambulatory Visit
